# Patient Record
Sex: Female | Born: 1980 | Race: Asian | Hispanic: No | Marital: Married | Smoking: Never smoker
Health system: Southern US, Community
[De-identification: ages and names within clinical notes are randomized; demographics above are authoritative.]

## PROBLEM LIST (undated history)

## (undated) ENCOUNTER — Inpatient Hospital Stay (HOSPITAL_COMMUNITY): Payer: Self-pay

## (undated) HISTORY — PX: CHOLECYSTECTOMY: SHX55

---

## 2006-03-04 ENCOUNTER — Ambulatory Visit: Payer: Self-pay | Admitting: Internal Medicine

## 2006-03-04 DIAGNOSIS — R1013 Epigastric pain: Secondary | ICD-10-CM | POA: Insufficient documentation

## 2006-03-25 ENCOUNTER — Ambulatory Visit: Payer: Self-pay | Admitting: Family Medicine

## 2006-03-25 DIAGNOSIS — K219 Gastro-esophageal reflux disease without esophagitis: Secondary | ICD-10-CM | POA: Insufficient documentation

## 2006-04-04 ENCOUNTER — Ambulatory Visit: Payer: Self-pay | Admitting: Internal Medicine

## 2006-06-16 ENCOUNTER — Ambulatory Visit: Payer: Self-pay | Admitting: Family Medicine

## 2006-07-14 ENCOUNTER — Ambulatory Visit: Payer: Self-pay | Admitting: Family Medicine

## 2006-07-15 ENCOUNTER — Ambulatory Visit: Payer: Self-pay | Admitting: Family Medicine

## 2006-09-22 ENCOUNTER — Ambulatory Visit: Payer: Self-pay | Admitting: Family Medicine

## 2006-10-07 ENCOUNTER — Ambulatory Visit: Payer: Self-pay | Admitting: Internal Medicine

## 2006-12-29 ENCOUNTER — Ambulatory Visit: Payer: Self-pay | Admitting: Family Medicine

## 2006-12-29 LAB — CONVERTED CEMR LAB: Beta hcg, urine, semiquantitative: NEGATIVE

## 2007-02-14 ENCOUNTER — Emergency Department (HOSPITAL_COMMUNITY): Admission: EM | Admit: 2007-02-14 | Discharge: 2007-02-14 | Payer: Self-pay | Admitting: Family Medicine

## 2007-03-21 ENCOUNTER — Emergency Department (HOSPITAL_COMMUNITY): Admission: EM | Admit: 2007-03-21 | Discharge: 2007-03-21 | Payer: Self-pay | Admitting: Family Medicine

## 2007-03-21 ENCOUNTER — Encounter (INDEPENDENT_AMBULATORY_CARE_PROVIDER_SITE_OTHER): Payer: Self-pay | Admitting: Family Medicine

## 2007-03-23 ENCOUNTER — Telehealth (INDEPENDENT_AMBULATORY_CARE_PROVIDER_SITE_OTHER): Payer: Self-pay | Admitting: *Deleted

## 2007-03-23 ENCOUNTER — Ambulatory Visit: Payer: Self-pay | Admitting: Family Medicine

## 2007-03-25 ENCOUNTER — Ambulatory Visit: Payer: Self-pay | Admitting: Family Medicine

## 2007-04-18 ENCOUNTER — Emergency Department (HOSPITAL_COMMUNITY): Admission: EM | Admit: 2007-04-18 | Discharge: 2007-04-18 | Payer: Self-pay | Admitting: Family Medicine

## 2007-06-15 ENCOUNTER — Ambulatory Visit: Payer: Self-pay | Admitting: Family Medicine

## 2007-07-17 ENCOUNTER — Ambulatory Visit: Payer: Self-pay | Admitting: Internal Medicine

## 2007-07-21 ENCOUNTER — Ambulatory Visit (HOSPITAL_COMMUNITY): Admission: RE | Admit: 2007-07-21 | Discharge: 2007-07-21 | Payer: Self-pay | Admitting: Internal Medicine

## 2007-07-23 LAB — CONVERTED CEMR LAB
Alkaline Phosphatase: 58 units/L (ref 39–117)
Amylase: 37 units/L (ref 0–105)
BUN: 9 mg/dL (ref 6–23)
Basophils Absolute: 0 10*3/uL (ref 0.0–0.1)
Calcium: 9.3 mg/dL (ref 8.4–10.5)
Eosinophils Relative: 1 % (ref 0–5)
Glucose, Bld: 97 mg/dL (ref 70–99)
Lipase: 16 units/L (ref 0–75)
Lymphs Abs: 2.6 10*3/uL (ref 0.7–4.0)
Monocytes Absolute: 0.6 10*3/uL (ref 0.1–1.0)
Neutro Abs: 4.9 10*3/uL (ref 1.7–7.7)
Neutrophils Relative %: 59 % (ref 43–77)
Potassium: 3.9 meq/L (ref 3.5–5.3)
RBC: 5.41 M/uL — ABNORMAL HIGH (ref 3.87–5.11)
Total Bilirubin: 0.5 mg/dL (ref 0.3–1.2)
Total Protein: 7.8 g/dL (ref 6.0–8.3)

## 2007-07-24 ENCOUNTER — Encounter (INDEPENDENT_AMBULATORY_CARE_PROVIDER_SITE_OTHER): Payer: Self-pay | Admitting: *Deleted

## 2007-08-20 ENCOUNTER — Ambulatory Visit: Payer: Self-pay | Admitting: Internal Medicine

## 2007-08-20 DIAGNOSIS — G4700 Insomnia, unspecified: Secondary | ICD-10-CM | POA: Insufficient documentation

## 2007-09-07 ENCOUNTER — Ambulatory Visit: Payer: Self-pay | Admitting: Family Medicine

## 2007-09-18 ENCOUNTER — Ambulatory Visit: Payer: Self-pay | Admitting: Internal Medicine

## 2007-10-29 ENCOUNTER — Ambulatory Visit: Payer: Self-pay | Admitting: Internal Medicine

## 2007-10-29 DIAGNOSIS — F329 Major depressive disorder, single episode, unspecified: Secondary | ICD-10-CM | POA: Insufficient documentation

## 2007-10-29 DIAGNOSIS — R519 Headache, unspecified: Secondary | ICD-10-CM | POA: Insufficient documentation

## 2007-10-29 DIAGNOSIS — R51 Headache: Secondary | ICD-10-CM

## 2007-11-04 ENCOUNTER — Encounter (INDEPENDENT_AMBULATORY_CARE_PROVIDER_SITE_OTHER): Payer: Self-pay | Admitting: Internal Medicine

## 2007-11-04 ENCOUNTER — Encounter: Admission: RE | Admit: 2007-11-04 | Discharge: 2007-12-08 | Payer: Self-pay | Admitting: Internal Medicine

## 2007-11-20 ENCOUNTER — Ambulatory Visit: Payer: Self-pay | Admitting: Internal Medicine

## 2008-04-03 ENCOUNTER — Emergency Department (HOSPITAL_COMMUNITY): Admission: EM | Admit: 2008-04-03 | Discharge: 2008-04-03 | Payer: Self-pay | Admitting: Family Medicine

## 2009-08-14 ENCOUNTER — Ambulatory Visit: Payer: Self-pay | Admitting: Internal Medicine

## 2009-08-17 ENCOUNTER — Ambulatory Visit (HOSPITAL_COMMUNITY): Admission: RE | Admit: 2009-08-17 | Discharge: 2009-08-17 | Payer: Self-pay | Admitting: Internal Medicine

## 2009-08-17 ENCOUNTER — Encounter (INDEPENDENT_AMBULATORY_CARE_PROVIDER_SITE_OTHER): Payer: Self-pay | Admitting: Internal Medicine

## 2009-08-17 LAB — CONVERTED CEMR LAB
Albumin: 4.5 g/dL (ref 3.5–5.2)
Basophils Relative: 0 % (ref 0–1)
Eosinophils Absolute: 0.1 10*3/uL (ref 0.0–0.7)
Eosinophils Relative: 1 % (ref 0–5)
Glucose, Bld: 100 mg/dL — ABNORMAL HIGH (ref 70–99)
Hemoglobin: 10 g/dL — ABNORMAL LOW (ref 12.0–15.0)
Lymphs Abs: 2 10*3/uL (ref 0.7–4.0)
Monocytes Absolute: 0.5 10*3/uL (ref 0.1–1.0)
Monocytes Relative: 8 % (ref 3–12)
Neutrophils Relative %: 55 % (ref 43–77)
RBC: 4.59 M/uL (ref 3.87–5.11)
RDW: 14.7 % (ref 11.5–15.5)
WBC: 5.5 10*3/uL (ref 4.0–10.5)

## 2009-08-18 ENCOUNTER — Encounter (INDEPENDENT_AMBULATORY_CARE_PROVIDER_SITE_OTHER): Payer: Self-pay | Admitting: Internal Medicine

## 2009-08-18 DIAGNOSIS — K8020 Calculus of gallbladder without cholecystitis without obstruction: Secondary | ICD-10-CM | POA: Insufficient documentation

## 2009-08-24 DIAGNOSIS — D509 Iron deficiency anemia, unspecified: Secondary | ICD-10-CM

## 2009-08-24 LAB — CONVERTED CEMR LAB
Iron: 23 ug/dL — ABNORMAL LOW (ref 42–145)
TIBC: 393 ug/dL (ref 250–470)
Vitamin B-12: 298 pg/mL (ref 211–911)

## 2009-09-18 ENCOUNTER — Ambulatory Visit (HOSPITAL_COMMUNITY)
Admission: RE | Admit: 2009-09-18 | Discharge: 2009-09-18 | Payer: Self-pay | Source: Home / Self Care | Admitting: Surgery

## 2009-10-31 ENCOUNTER — Ambulatory Visit: Payer: Self-pay | Admitting: Internal Medicine

## 2009-10-31 DIAGNOSIS — R059 Cough, unspecified: Secondary | ICD-10-CM | POA: Insufficient documentation

## 2009-10-31 DIAGNOSIS — R05 Cough: Secondary | ICD-10-CM

## 2010-01-26 ENCOUNTER — Ambulatory Visit: Payer: Self-pay | Admitting: Internal Medicine

## 2010-01-26 LAB — CONVERTED CEMR LAB
Albumin: 4.8 g/dL (ref 3.5–5.2)
Amylase: 50 units/L (ref 0–105)
BUN: 7 mg/dL (ref 6–23)
Basophils Relative: 0 % (ref 0–1)
Calcium: 8.7 mg/dL (ref 8.4–10.5)
Creatinine, Ser: 0.63 mg/dL (ref 0.40–1.20)
Eosinophils Relative: 1 % (ref 0–5)
Lipase: 19 units/L (ref 0–75)
Lymphocytes Relative: 24 % (ref 12–46)
MCHC: 33 g/dL (ref 30.0–36.0)
MCV: 73.5 fL — ABNORMAL LOW (ref 78.0–100.0)
Neutro Abs: 5 10*3/uL (ref 1.7–7.7)
Neutrophils Relative %: 66 % (ref 43–77)
Platelets: 217 10*3/uL (ref 150–400)
Potassium: 3.6 meq/L (ref 3.5–5.3)
Sodium: 139 meq/L (ref 135–145)
Total Protein: 7.6 g/dL (ref 6.0–8.3)
WBC: 7.6 10*3/uL (ref 4.0–10.5)

## 2010-02-14 ENCOUNTER — Encounter (INDEPENDENT_AMBULATORY_CARE_PROVIDER_SITE_OTHER): Payer: Self-pay | Admitting: Internal Medicine

## 2010-03-27 NOTE — Letter (Signed)
Summary: TEST ORDER FORM//ULTRASOUND//APPT DATE & TIME  TEST ORDER FORM//ULTRASOUND//APPT DATE & TIME   Imported By: Arta Bruce 08/15/2009 15:49:48  _____________________________________________________________________  External Attachment:    Type:   Image     Comment:   External Document

## 2010-03-27 NOTE — Assessment & Plan Note (Signed)
Summary: STOMACH PAIN//GK   Vital Signs:  Patient profile:   30 year old female Weight:      126 pounds BMI:     23.13 Temp:     97.8 degrees F Pulse rate:   54 / minute Pulse rhythm:   regular Resp:     18 per minute BP sitting:   92 / 57  (right arm) Cuff size:   regular  Vitals Entered By: Vesta Mixer CMA (August 14, 2009 4:03 PM) CC: HA and stomach ache since yesterday.  No nausea or vomitting.  She thinks she had a fever.  Is Patient Diabetic? No Pain Assessment Patient in pain? yes     Location: stomach Intensity: 5  Does patient need assistance? Ambulation Normal   CC:  HA and stomach ache since yesterday.  No nausea or vomitting.  She thinks she had a fever. Marland Kitchen  History of Present Illness: 1.  Right upper quadrant pain:  started yesterday afternoon about 1 p.m.  Radiates to back/right shoulder blade.  Ate squash and rice for lunch right before the pain started.  Pain is constant.  Hurts more at times, but not affected by eating, bowel movement.  No definite diarrhea or constipation.  No nausea or vomiting.  Has also felt like she has had a headache and fever.  Has taken Tylenol.  Last dose was last evening.  Pt. with simlar complaints in 06/2007.  Ultrasound and ultimately CT of abdomen were both normal.  Allergies (verified): No Known Drug Allergies  Physical Exam  General:  NAD Lungs:  Normal respiratory effort, chest expands symmetrically. Lungs are clear to auscultation, no crackles or wheezes. Heart:  Normal rate and regular rhythm. S1 and S2 normal without gallop, murmur, click, rub or other extra sounds. Abdomen:  soft, normal bowel sounds, no distention, no masses, no rebound tenderness, no hepatomegaly, and no splenomegaly.  Possible mild Murph's sign   Impression & Recommendations:  Problem # 1:  EPIGASTRIC PAIN (ICD-789.06) With history of possible fever, will reevaluate, but similar work up 2 years ago negative Orders: T-Comprehensive Metabolic  Panel ([id number]) T-CBC w/Diff ([id number]) Ultrasound (Ultrasound) T-Amylase ([id number]) T-Lipase ([id number])  Complete Medication List: 1)  Depo-provera 150 Mg/ml Im Susp (Medroxyprogesterone acetate) 2)  Zoloft 50 Mg Tabs (Sertraline hcl) .... 1/2 tab by mouth for 7 days, then 1 tab daily thereafter. 3)  Pepcid 40 Mg Tabs (Famotidine) .... Take 1 tablet by mouth daily  Patient Instructions: 1)  Follow up with Dr. Delrae Alfred in 2 months--epigastric pain Prescriptions: PEPCID 40 MG TABS (FAMOTIDINE) Take 1 tablet by mouth daily  #30 x 3   Entered and Authorized by:   Julieanne Manson MD   Signed by:   Julieanne Manson MD on 08/14/2009   Method used:   Electronically to        [facility] [phone]* (retail)       [address]       [place], [place]  [address]       Ph: [phone]       Fax: [phone]   RxID:   [id number]

## 2010-03-27 NOTE — Assessment & Plan Note (Signed)
Summary: STOMACH PAIN//MC   Vital Signs:  Patient profile:   30 year old female Menstrual status:  regular LMP:     01/02/2010 Weight:      119.44 pounds Temp:     97.8 degrees F oral Pulse rate:   80 / minute Pulse rhythm:   regular Resp:     22 per minute BP sitting:   102 / 64  (left arm) Cuff size:   regular  Vitals Entered By: Hale Drone CMA (January 26, 2010 12:27 PM) CC: Here for stomach pains. Having HA's and feeling nasueas since last week. After eating chicken, she starts to itch in her abd. area for about 20 minutes.  Is Patient Diabetic? No Pain Assessment Patient in pain? yes     Location: stomach  Intensity: 1 Type: sharp Onset of pain  Eating certain food  Does patient need assistance? Functional Status Self care Ambulation Normal Comments ** Please note pt. speaks [place]. Had a hard time getting through her. Seems she can speak a little but hard for her to understand some medical terminology. Called the languae line and they do not have [place] or [place] which she speaks. *** LMP (date): 01/02/2010     Menstrual Status regular Enter LMP: 01/02/2010   CC:  Here for stomach pains. Having HA's and feeling nasueas since last week. After eating chicken and she starts to itch in her abd. area for about 20 minutes. .  History of Present Illness:  No interpreter available for pt's [place] dialect.  Right upper quadrant pain that radiates around to same level at back.  Not clear if this is similar to what she was experiencing before having gallbladder removed in July of this year.  Not clear if has associated nausea.  No vomiting.  No diarrhea or constipation.  Last time had pain was last month--had 2 episodes lasting maybe 1 1/2 days.  Does recall that ate chicken and hot pepper and got the pain.  Does have a headache with the pain.  Current Medications (verified): 1)  Depo-Provera 150 Mg/ml Im Susp (Medroxyprogesterone Acetate) 2)  Zoloft 50 Mg  Tabs  (Sertraline Hcl) .... 1/2 Tab By Mouth For 7 Days, Then 1 Tab Daily Thereafter. 3)  Pepcid 40 Mg Tabs (Famotidine) .... Take 1 Tablet By Mouth Daily 4)  Ferrous Sulfate 325 (65 Fe) Mg Tabs (Ferrous Sulfate) .Marland Kitchen.. 1 Tab By Mouth Daily 5)  Loratadine 10 Mg Tabs (Loratadine) .Marland Kitchen.. 1 Tab By Mouth Daily  Allergies (verified): No Known Drug Allergies  Physical Exam  General:  NAD Lungs:  Normal respiratory effort, chest expands symmetrically. Lungs are clear to auscultation, no crackles or wheezes. Heart:  Normal rate and regular rhythm. S1 and S2 normal without gallop, murmur, click, rub or other extra sounds. Abdomen:  Mild midepigastric tenderness and scattered areas of tenderness of general abdomen.  Soft, No HSM or mass appreciated.  No rebound or peritoneal signs   Impression & Recommendations:  Problem # 1:  EPIGASTRIC PAIN (ICD-789.06) Limited by language barrier. Unable to use Language line as they do not have someone available for this [place] dialect. Sounds like pt. had some GI disturbance that has since passed, but will check labs secondary to minimal tenderness today. Orders: T-Comprehensive Metabolic Panel [id number]) T-CBC w/Diff [id number]) T-Amylase [id number]) T-Lipase [id number])  Complete Medication List: 1)  Depo-provera 150 Mg/ml Im Susp (Medroxyprogesterone acetate) 2)  Zoloft 50 Mg Tabs (Sertraline hcl) .... 1/2 tab by mouth for 7 days, then  1 tab daily thereafter. 3)  Pepcid 40 Mg Tabs (Famotidine) .... Take 1 tablet by mouth daily 4)  Ferrous Sulfate 325 (65 Fe) Mg Tabs (Ferrous sulfate) .Marland Kitchen.. 1 tab by mouth daily 5)  Loratadine 10 Mg Tabs (Loratadine) .Marland Kitchen.. 1 tab by mouth daily  Patient Instructions: 1)  Call if your pain comes back--make sure you bring in someone who can intepret you language to English when you come back   Orders Added: 1)  Est. Patient Level III [[address]] 2)  T-Comprehensive Metabolic Panel [[id number]] 3)  T-CBC w/Diff  [[id number]] 4)  T-Amylase [[id number]] 5)  T-Lipase [[id number]]

## 2010-03-27 NOTE — Assessment & Plan Note (Signed)
Summary: two month f/u////cns   Vital Signs:  Patient profile:   30 year old female Height:      62 inches Weight:      119.5 pounds BMI:     21.94 Temp:     97.8 degrees F oral Pulse rate:   64 / minute Pulse rhythm:   regular Resp:     16 per minute BP sitting:   92 / 64  (right arm) Cuff size:   regular  Vitals Entered By: Michelle Nasuti (October 31, 2009 11:43 AM) CC: c/o cough causing pains in ribs/stomach. sore throat an f/u after surgery? Pain Assessment Patient in pain? yes      Intensity: 1  Does patient need assistance? Functional Status Self care Ambulation Normal   CC:  c/o cough causing pains in ribs/stomach. sore throat an f/u after surgery?.  History of Present Illness: 1.  Cholelithiasis:  had lap cholecystectomy July 25.  Did well.  Dr. Corliss Skains  2.  Cough:  Started one week after surgery above.  Nonproductive.  No fever.  Has had a headache since last night as well.  Feels cough is coming from epigastric, low sternal area--sounds like because when she coughs, it hurts there.  .  No dyspnea.  Has had a sore throat if coughing a lot. No ear pain.  Sometimes has sneezing.  No runny nose. Has tried Dayquil and Tylenol.  Has helped with the cough.  Allergies (verified): No Known Drug Allergies  Past History:  Past Surgical History: 1.  s/p wisdom teeth extraction. 2.  09/18/09:  Laparoscopic Cholecystectomy--Dr. Corliss Skains  Physical Exam  General:  NAD Eyes:  No conjunctival injection Ears:  External ear exam shows no significant lesions or deformities.  Otoscopic examination reveals clear canals, tympanic membranes are intact bilaterally without bulging, retraction, inflammation or discharge. Hearing is grossly normal bilaterally. Nose:  mucosa with mild swelling, clear discharge. Mouth:  Mild cobbling of posterior pharnynx Neck:  No deformities, masses, or tenderness noted. Lungs:  Normal respiratory effort, chest expands symmetrically. Lungs are clear to  auscultation, no crackles or wheezes. Heart:  Normal rate and regular rhythm. S1 and S2 normal without gallop, murmur, click, rub or other extra sounds. Abdomen:  Mild epigastic tenderness--seems more like of the musculaturesoft, normal bowel sounds, no hepatomegaly, and no splenomegaly.  Surgical wounds healing well.   Impression & Recommendations:  Problem # 1:  COUGH (ICD-786.2) Probable allergies Start Loratadine.  Complete Medication List: 1)  Depo-provera 150 Mg/ml Im Susp (Medroxyprogesterone acetate) 2)  Zoloft 50 Mg Tabs (Sertraline hcl) .... 1/2 tab by mouth for 7 days, then 1 tab daily thereafter. 3)  Pepcid 40 Mg Tabs (Famotidine) .... Take 1 tablet by mouth daily 4)  Ferrous Sulfate 325 (65 Fe) Mg Tabs (Ferrous sulfate) .Marland Kitchen.. 1 tab by mouth daily 5)  Loratadine 10 Mg Tabs (Loratadine) .Marland Kitchen.. 1 tab by mouth daily  Patient Instructions: 1)  Pick up your allergy medicine at [facility] Prescriptions: LORATADINE 10 MG TABS (LORATADINE) 1 tab by mouth daily  #30 x 4   Entered and Authorized by:   Julieanne Manson MD   Signed by:   Julieanne Manson MD on 10/31/2009   Method used:   Faxed to ...       [facility] (retail)       [address] [place], [place]  [address]       Ph: [phone]  Fax: [phone]   RxID:   [id number]

## 2010-03-29 NOTE — Letter (Signed)
Summary: *HSN Results Follow up  [facility]  [address] [place], [place] [address]   Phone: [phone]  Fax: [phone]      02/14/2010   Mikela Sakata [address] [place], [place]  [address]   Dear  Ms. Conita Schepp,                            ____S.Drinkard,FNP   ____D. Gore,FNP       ____B. McPherson,MD   ____V. Rankins,MD    __X__E. Nobuko Gsell,MD    ____N. Daphine Deutscher, FNP  ____D. Reche Dixon, MD    ____K. Philipp Deputy, MD    ____Other     This letter is to inform you that your recent test(s):  _______Pap Smear    ___X____Lab Test     _______X-ray    ___X____ is within acceptable limits  _______ requires a medication change  _______ requires a follow-up lab visit  _______ requires a follow-up visit with your provider   Comments:  Your labs were okay except you still have a little low iron.  Would like you to take iron--have called into [facility] pharmacy.  Take 1 tab daily.         _________________________________________________________ If you have any questions, please contact our office                     Sincerely,  Julieanne Manson MD [facility]

## 2010-03-29 NOTE — Miscellaneous (Signed)
  Clinical Lists Changes  Medications: Rx of FERROUS SULFATE 325 (65 FE) MG TABS (FERROUS SULFATE) 1 tab by mouth daily;  #30 x 4;  Signed;  Entered by: Julieanne Manson MD;  Authorized by: Julieanne Manson MD;  Method used: Faxed to [facility], [address], [place], [place]  [address], Ph: [phone], Fax: [phone]    Prescriptions: FERROUS SULFATE 325 (65 FE) MG TABS (FERROUS SULFATE) 1 tab by mouth daily  #30 x 4   Entered and Authorized by:   Julieanne Manson MD   Signed by:   Julieanne Manson MD on 02/14/2010   Method used:   Faxed to ...       [facility] (retail)       [address] [place], [place]  [address]       Ph: [phone]       Fax: [phone]   RxID:   [id number]

## 2010-05-12 LAB — CBC
HCT: 32.4 % — ABNORMAL LOW (ref 36.0–46.0)
MCH: 22.6 pg — ABNORMAL LOW (ref 26.0–34.0)
Platelets: 166 10*3/uL (ref 150–400)
RBC: 4.71 MIL/uL (ref 3.87–5.11)
WBC: 5.6 10*3/uL (ref 4.0–10.5)

## 2010-05-12 LAB — COMPREHENSIVE METABOLIC PANEL
ALT: 10 U/L (ref 0–35)
Albumin: 3.8 g/dL (ref 3.5–5.2)
Alkaline Phosphatase: 37 U/L — ABNORMAL LOW (ref 39–117)
BUN: 6 mg/dL (ref 6–23)
CO2: 25 mEq/L (ref 19–32)
Glucose, Bld: 97 mg/dL (ref 70–99)
Potassium: 3.1 mEq/L — ABNORMAL LOW (ref 3.5–5.1)
Sodium: 139 mEq/L (ref 135–145)
Total Protein: 6.9 g/dL (ref 6.0–8.3)

## 2010-05-12 LAB — DIFFERENTIAL
Basophils Absolute: 0 10*3/uL (ref 0.0–0.1)
Lymphocytes Relative: 35 % (ref 12–46)
Monocytes Relative: 12 % (ref 3–12)

## 2010-11-04 ENCOUNTER — Inpatient Hospital Stay (INDEPENDENT_AMBULATORY_CARE_PROVIDER_SITE_OTHER)
Admission: RE | Admit: 2010-11-04 | Discharge: 2010-11-04 | Disposition: A | Discharge status: Home / Self Care | Payer: Self-pay | Source: Ambulatory Visit | Attending: Family Medicine | Admitting: Family Medicine

## 2010-11-04 ENCOUNTER — Emergency Department (HOSPITAL_COMMUNITY): Payer: Self-pay

## 2010-11-04 ENCOUNTER — Emergency Department (HOSPITAL_COMMUNITY)
Admission: EM | Admit: 2010-11-04 | Discharge: 2010-11-04 | Disposition: A | Discharge status: Home / Self Care | Payer: Self-pay | Attending: Emergency Medicine | Admitting: Emergency Medicine

## 2010-11-04 DIAGNOSIS — R1011 Right upper quadrant pain: Secondary | ICD-10-CM | POA: Insufficient documentation

## 2010-11-04 DIAGNOSIS — K8020 Calculus of gallbladder without cholecystitis without obstruction: Secondary | ICD-10-CM | POA: Insufficient documentation

## 2010-11-04 DIAGNOSIS — R109 Unspecified abdominal pain: Secondary | ICD-10-CM

## 2010-11-04 DIAGNOSIS — R110 Nausea: Secondary | ICD-10-CM | POA: Insufficient documentation

## 2010-11-04 LAB — DIFFERENTIAL
Basophils Absolute: 0 10*3/uL (ref 0.0–0.1)
Basophils Relative: 0 % (ref 0–1)
Eosinophils Absolute: 0.1 10*3/uL (ref 0.0–0.7)
Eosinophils Relative: 1 % (ref 0–5)
Lymphocytes Relative: 34 % (ref 12–46)
Lymphs Abs: 2 10*3/uL (ref 0.7–4.0)
Monocytes Relative: 8 % (ref 3–12)
Neutrophils Relative %: 57 % (ref 43–77)

## 2010-11-04 LAB — HEPATIC FUNCTION PANEL
AST: 18 U/L (ref 0–37)
Albumin: 4.1 g/dL (ref 3.5–5.2)
Alkaline Phosphatase: 38 U/L — ABNORMAL LOW (ref 39–117)
Total Bilirubin: 0.2 mg/dL — ABNORMAL LOW (ref 0.3–1.2)
Total Protein: 7.4 g/dL (ref 6.0–8.3)

## 2010-11-04 LAB — BASIC METABOLIC PANEL
BUN: 8 mg/dL (ref 6–23)
Calcium: 9.6 mg/dL (ref 8.4–10.5)
GFR calc Af Amer: >60 mL/min (ref >60)
GFR calc non Af Amer: >60 mL/min (ref >60)

## 2010-11-04 LAB — CBC
HCT: 35.7 % — ABNORMAL LOW (ref 36.0–46.0)
MCH: 25.9 pg — ABNORMAL LOW (ref 26.0–34.0)
RBC: 4.78 MIL/uL (ref 3.87–5.11)
RDW: 12.4 % (ref 11.5–15.5)
WBC: 5.8 10*3/uL (ref 4.0–10.5)

## 2010-11-04 LAB — URINALYSIS, ROUTINE W REFLEX MICROSCOPIC
Glucose, UA: NEGATIVE mg/dL
Protein, ur: NEGATIVE mg/dL
Specific Gravity, Urine: 1.016 (ref 1.005–1.030)
Urobilinogen, UA: 1 mg/dL (ref 0.0–1.0)
pH: 7 (ref 5.0–8.0)

## 2010-11-04 LAB — POCT URINALYSIS DIP (DEVICE)
Leukocytes, UA: NEGATIVE
Protein, ur: NEGATIVE mg/dL
Urobilinogen, UA: 0.2 mg/dL (ref 0.0–1.0)
pH: 6 (ref 5.0–8.0)

## 2010-11-04 LAB — LIPASE, BLOOD: Lipase: 26 U/L (ref 11–59)

## 2010-11-04 LAB — URINE MICROSCOPIC-ADD ON

## 2010-11-15 LAB — CBC
MCHC: 33
MCV: 75.5 — ABNORMAL LOW
Platelets: 237
WBC: 11.6 — ABNORMAL HIGH

## 2010-11-15 LAB — POCT URINALYSIS DIP (DEVICE)
Bilirubin Urine: NEGATIVE
Nitrite: NEGATIVE
Operator id: 270961
pH: 7

## 2010-11-15 LAB — DIFFERENTIAL
Eosinophils Relative: 1
Lymphocytes Relative: 28
Lymphs Abs: 3.3
Monocytes Absolute: 0.9

## 2010-11-15 LAB — COMPREHENSIVE METABOLIC PANEL
AST: 21
Albumin: 4.5
Calcium: 9.5
Chloride: 102
Creatinine, Ser: 0.7
GFR calc Af Amer: >60

## 2010-11-15 LAB — POCT PREGNANCY, URINE: Preg Test, Ur: NEGATIVE

## 2010-11-16 LAB — POCT URINALYSIS DIP (DEVICE)
Glucose, UA: NEGATIVE
Ketones, ur: NEGATIVE
Operator id: 247071
Specific Gravity, Urine: 1.02

## 2010-11-30 LAB — POCT RAPID STREP A: Streptococcus, Group A Screen (Direct): POSITIVE — AB

## 2011-10-06 ENCOUNTER — Encounter (HOSPITAL_COMMUNITY): Payer: Self-pay

## 2011-10-06 ENCOUNTER — Emergency Department (HOSPITAL_COMMUNITY)
Admission: EM | Admit: 2011-10-06 | Discharge: 2011-10-06 | Disposition: A | Discharge status: Home / Self Care | Payer: Self-pay | Source: Home / Self Care | Attending: Emergency Medicine | Admitting: Emergency Medicine

## 2011-10-06 DIAGNOSIS — N946 Dysmenorrhea, unspecified: Secondary | ICD-10-CM

## 2011-10-06 DIAGNOSIS — L259 Unspecified contact dermatitis, unspecified cause: Secondary | ICD-10-CM

## 2011-10-06 LAB — POCT URINALYSIS DIP (DEVICE)
Glucose, UA: NEGATIVE mg/dL
Ketones, ur: NEGATIVE mg/dL
Specific Gravity, Urine: <=1.005 (ref 1.005–1.030)
Urobilinogen, UA: 0.2 mg/dL (ref 0.0–1.0)

## 2011-10-06 MED ORDER — LORATADINE 10 MG PO TABS: 10.0000 mg | ORAL_TABLET | Freq: Every day | ORAL | Status: DC

## 2011-10-06 MED ORDER — NAPROXEN 375 MG PO TABS: 375.0000 mg | ORAL_TABLET | Freq: Two times a day (BID) | ORAL | Status: AC | PRN

## 2011-10-06 NOTE — ED Provider Notes (Signed)
History     CSN: [id number]  Arrival date & time 10/06/11  1348   None     Chief Complaint  Patient presents with  . Sore Throat    (Consider location/radiation/quality/duration/timing/severity/associated sxs/prior treatment) HPI Comments: Pt reports that after her period started on 7/28, she developed mild sore throat, rash on cheeks, and itchy lips. Has a job sewing.  Also c/o abd pain.  Denies new contacts/lotions/soap/meds/detergents.  Isn't sure what she might have been exposed to at work sewing.   Patient is a 31 y.o. female presenting with pharyngitis, abdominal pain, and rash. The history is provided by the patient and a friend. A language interpreter was used (pt accompanied by friend who speaks english).  Sore Throat This is a new problem. Episode onset: 2.5 weeks ago. The problem occurs constantly. The problem has not changed since onset.Associated symptoms include abdominal pain. Pertinent negatives include no shortness of breath. Nothing aggravates the symptoms. Nothing relieves the symptoms.  Abdominal Pain The primary symptoms of the illness include abdominal pain and dysuria. The primary symptoms of the illness do not include fever, shortness of breath, nausea, vomiting, diarrhea or vaginal discharge. Episode onset: 2.5 weeks ago. The onset of the illness was gradual. The problem has not changed since onset. The dysuria is not associated with hematuria.   The patient states that she believes she is currently not pregnant. Symptoms associated with the illness do not include chills, anorexia, hematuria or back pain.  Rash  This is a new problem. Episode onset: 2.5 weeks ago. The problem has not changed since onset.The problem is associated with an unknown factor. There has been no fever. The rash is present on the face and lips. Associated symptoms include itching. Pertinent negatives include no pain. She has tried nothing for the symptoms.    History reviewed. No pertinent  past medical history.  Past Surgical History  Procedure Date  . Cholecystectomy     History reviewed. No pertinent family history.  History  Substance Use Topics  . Smoking status: Never Smoker   . Smokeless tobacco: Not on file  . Alcohol Use: No    OB History    Grav Para Term Preterm Abortions TAB SAB Ect Mult Living                  Review of Systems  Constitutional: Negative for fever and chills.  HENT: Positive for sore throat.        Itchy lips, but they are not swollen and she doesn't feel like her mouth/throat are swollen  Respiratory: Negative for shortness of breath.   Gastrointestinal: Positive for abdominal pain. Negative for nausea, vomiting, diarrhea and anorexia.  Genitourinary: Positive for dysuria. Negative for hematuria and vaginal discharge.  Musculoskeletal: Negative for back pain.  Skin: Positive for itching and rash.    Allergies  Review of patient's allergies indicates no known allergies.  Home Medications   Current Outpatient Rx  Name Route Sig Dispense Refill  . ACETAMINOPHEN 325 MG PO TABS Oral Take 650 mg by mouth every 6 (six) hours as needed.    Marland Kitchen LORATADINE 10 MG PO TABS Oral Take 1 tablet (10 mg total) by mouth daily. 30 tablet 3  . NAPROXEN 375 MG PO TABS Oral Take 1 tablet (375 mg total) by mouth 2 (two) times daily as needed. 30 tablet 0    BP 119/58  Pulse 65  Temp 98.3 F (36.8 C) (Oral)  Resp 18  SpO2 100%  LMP 09/22/2011  Physical Exam  Constitutional: She appears well-developed and well-nourished. No distress.  HENT:  Right Ear: Tympanic membrane, external ear and ear canal normal.  Left Ear: Tympanic membrane, external ear and ear canal normal.  Mouth/Throat: Uvula is midline, oropharynx is clear and moist and mucous membranes are normal. No oral lesions. No uvula swelling.  Cardiovascular: Normal rate and regular rhythm.   Pulmonary/Chest: Effort normal and breath sounds normal.  Abdominal: Soft. Bowel sounds are  normal. She exhibits no distension. There is tenderness in the suprapubic area. There is no rigidity, no rebound, no guarding and no CVA tenderness.  Skin: Skin is warm, dry and intact. Rash noted. Rash is macular.       Brown pigmentation on B cheeks, appears similar to melasma.     ED Course  Procedures (including critical care time)   Labs Reviewed  POCT URINALYSIS DIP (DEVICE)   No results found.   1. Contact dermatitis   2. Dysmenorrhea       MDM  After more conversation about abd pain, it appears pt has abd pain with her periods, and requests medicine to use for abd pain assoc with periods.  Her throat, mouth and lips appear normal, but she does have a patchy brown rash on her cheeks.  I think her sx might be connected, and related to a contact derm/allergic reaction to something.           Cathlyn Parsons, NP 10/06/11 2157

## 2011-10-06 NOTE — ED Provider Notes (Deleted)
History     CSN: [id number]  Arrival date & time 10/06/11  1348   None     Chief Complaint  Patient presents with  . Sore Throat    (Consider location/radiation/quality/duration/timing/severity/associated sxs/prior treatment) HPI Comments: Rash, sore throat and lip burning since 7/28 after period started.  C/o abd pain with periods.   Patient is a 31 y.o. female presenting with pharyngitis.  Sore Throat    History reviewed. No pertinent past medical history.  Past Surgical History  Procedure Date  . Cholecystectomy     History reviewed. No pertinent family history.  History  Substance Use Topics  . Smoking status: Never Smoker   . Smokeless tobacco: Not on file  . Alcohol Use: No    OB History    Grav Para Term Preterm Abortions TAB SAB Ect Mult Living                  Review of Systems  Allergies  Review of patient's allergies indicates no known allergies.  Home Medications   Current Outpatient Rx  Name Route Sig Dispense Refill  . ACETAMINOPHEN 325 MG PO TABS Oral Take 650 mg by mouth every 6 (six) hours as needed.    Marland Kitchen LORATADINE 10 MG PO TABS Oral Take 1 tablet (10 mg total) by mouth daily. 30 tablet 3  . NAPROXEN 375 MG PO TABS Oral Take 1 tablet (375 mg total) by mouth 2 (two) times daily as needed. 30 tablet 0    BP 119/58  Pulse 65  Temp 98.3 F (36.8 C) (Oral)  Resp 18  SpO2 100%  LMP 09/22/2011  Physical Exam  ED Course  Procedures (including critical care time)   Labs Reviewed  POCT URINALYSIS DIP (DEVICE)   No results found.   1. Contact dermatitis       MDM          Cathlyn Parsons, NP 10/06/11 1619

## 2011-10-06 NOTE — ED Notes (Signed)
Pt has had sore throat, headache and dry itchy lips since July 25.

## 2011-10-23 NOTE — ED Provider Notes (Signed)
Medical screening examination/treatment/procedure(s) were performed by non-physician practitioner and as supervising physician I was immediately available for consultation/collaboration.  Lema Heinkel M. MD   Corryn Madewell M Falana Clagg, MD 10/23/11 2106 

## 2012-01-14 IMAGING — US US ABDOMEN COMPLETE
1 series · 13 of 25 positions shown · non-contrast
Comparison: Abdominal ultrasound 03/21/2007.  CT abdomen
07/21/2007.

CLINICAL DATA: Epigastric abdominal pain.

COMPLETE ABDOMINAL ULTRASOUND 08/17/2009:

[Series 1: us abdomen complete · 0.24mm/px · 13 of 77 slices shown]
[im 1/77]
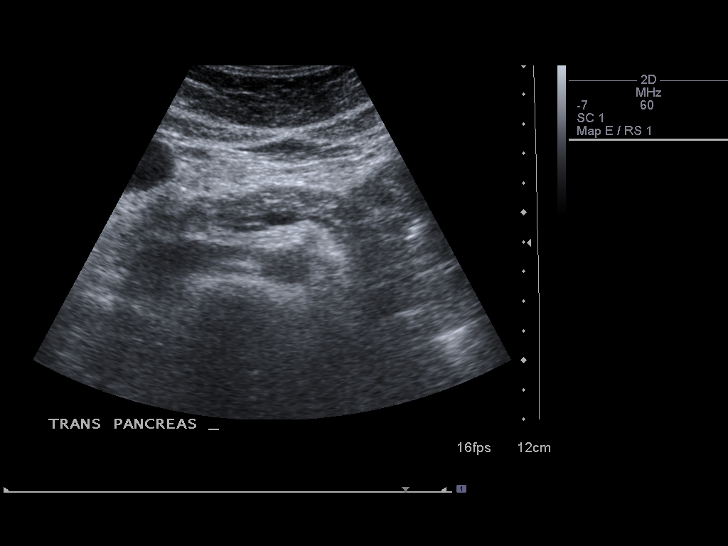
[im 7/77]
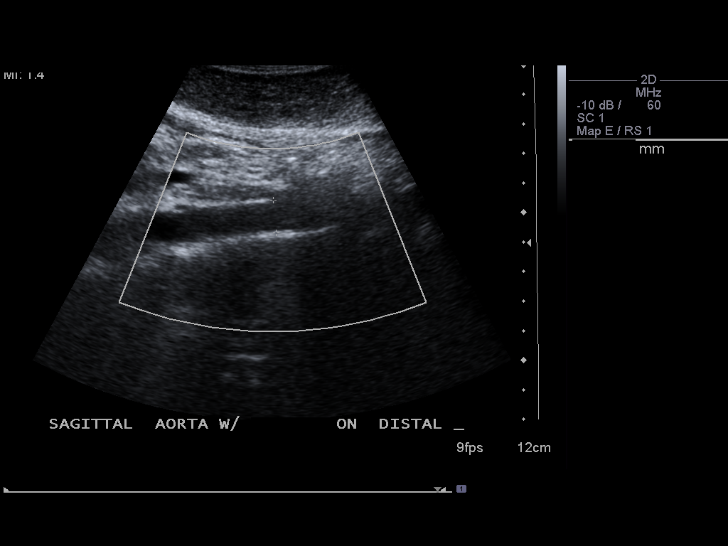
[im 13/77]
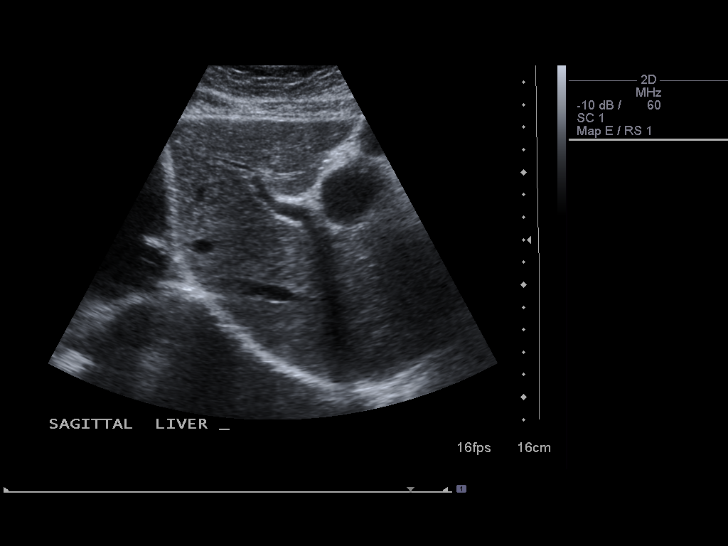
[im 20/77]
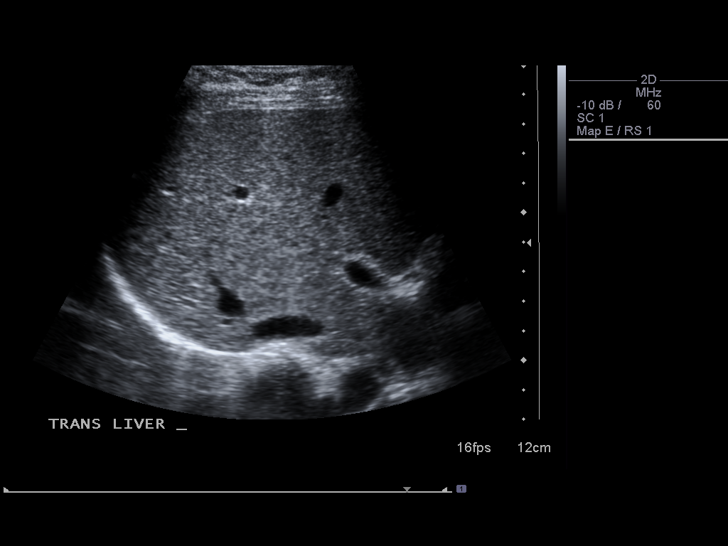
[im 26/77]
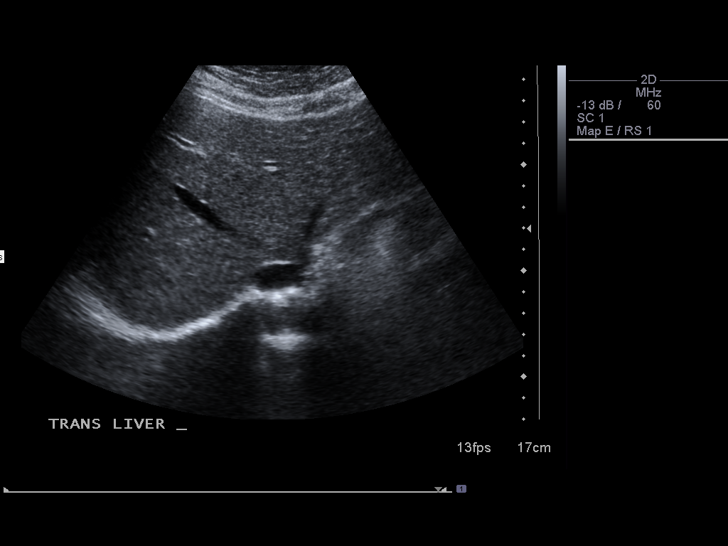
[im 32/77]
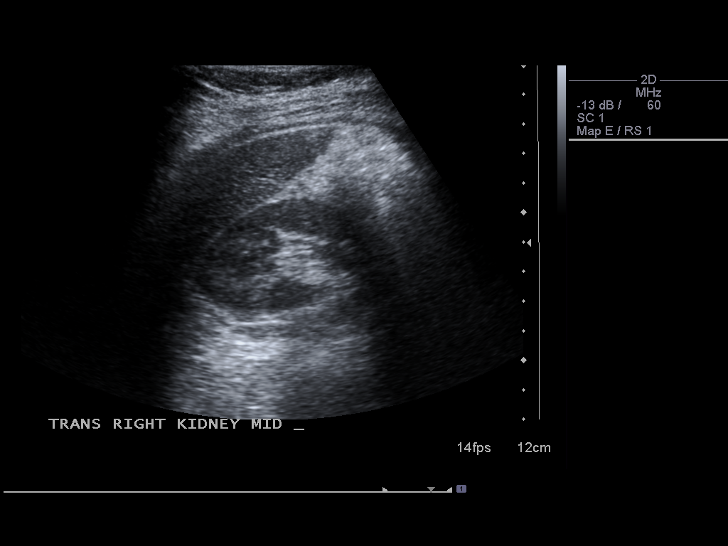
[im 39/77]
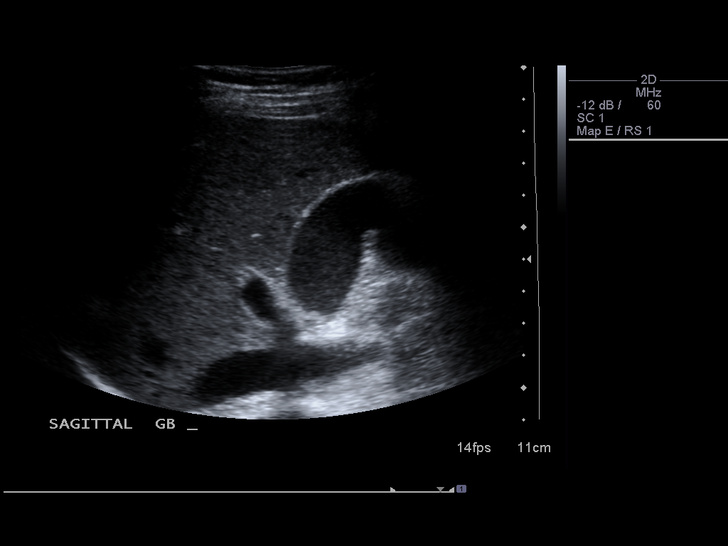
[im 45/77]
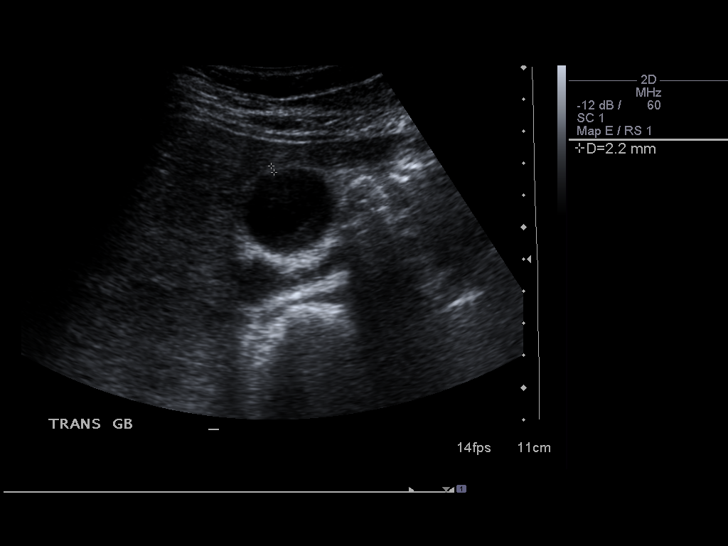
[im 51/77]
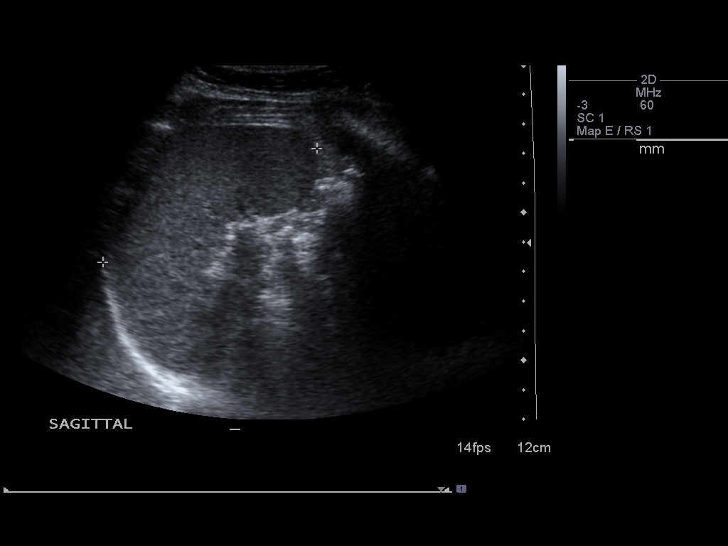
[im 58/77]
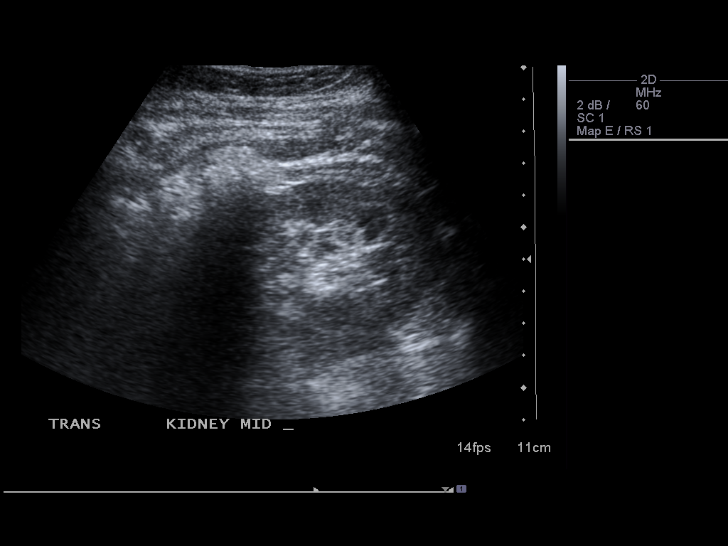
[im 64/77]
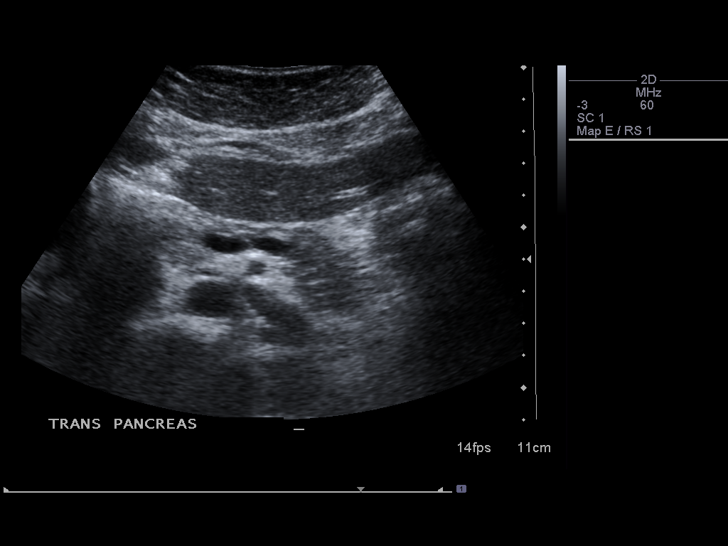
[im 70/77]
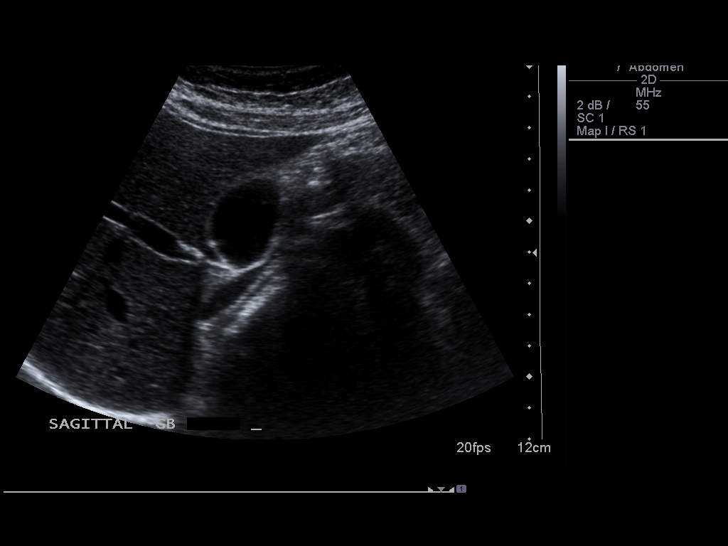
[im 77/77]
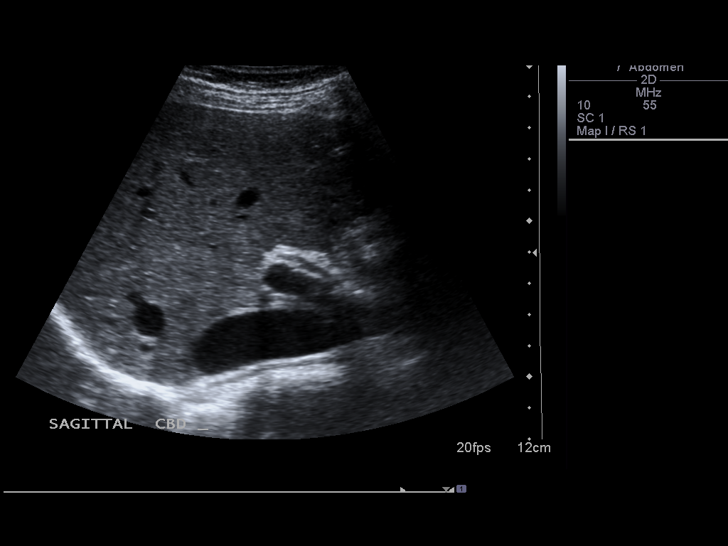

[13 of 25 positions shown; findings below may reference images not displayed]

FINDINGS: Gallbladder:  Small shadowing gallstones in the gallbladder neck,
on the order of 4 mm in size.  Echogenic sludge.  No gallbladder
wall thickening or pericholecystic fluid.  Negative sonographic
Murphy's sign according to the ultrasound technologist.

Common bile duct:  Normal in caliber with maximum diameter
approximating 3 mm.

Liver:  Normal size and echotexture without focal parenchymal
abnormality.  Patent portal vein with hepatopetal flow.

IVC:  Patent.

Pancreas:  Normal size and echotexture without focal parenchymal
abnormality .

Spleen:  Normal size and echotexture without focal parenchymal
abnormality.

Right Kidney:  No hydronephrosis.  Well-preserved cortex.  No
shadowing calculi.  Normal size and parenchymal echotexture without
focal abnormalities.

Left Kidney:  No hydronephrosis.  Well-preserved cortex.  No
shadowing calculi.  Normal size and parenchymal echotexture without
focal abnormalities.

Abdominal aorta:  Intimal aorta.
IMPRESSION: 1.  Cholelithiasis and gallbladder sludge.  No sonographic evidence
of acute cholecystitis.  No biliary ductal dilation.
2.  Otherwise normal abdominal ultrasound.

## 2012-11-07 ENCOUNTER — Inpatient Hospital Stay (HOSPITAL_COMMUNITY): Payer: Medicaid Other

## 2012-11-07 ENCOUNTER — Encounter (HOSPITAL_COMMUNITY): Payer: Self-pay | Admitting: Emergency Medicine

## 2012-11-07 ENCOUNTER — Emergency Department (INDEPENDENT_AMBULATORY_CARE_PROVIDER_SITE_OTHER)
Admission: EM | Admit: 2012-11-07 | Discharge: 2012-11-07 | Disposition: A | Discharge status: Home / Self Care | Payer: Medicaid Other | Source: Home / Self Care

## 2012-11-07 ENCOUNTER — Encounter (HOSPITAL_COMMUNITY): Payer: Self-pay | Admitting: *Deleted

## 2012-11-07 ENCOUNTER — Inpatient Hospital Stay (HOSPITAL_COMMUNITY)
Admission: AD | Admit: 2012-11-07 | Discharge: 2012-11-07 | Disposition: A | Discharge status: Home / Self Care | Payer: Medicaid Other | Source: Ambulatory Visit | Attending: Obstetrics & Gynecology | Admitting: Obstetrics & Gynecology

## 2012-11-07 DIAGNOSIS — O99891 Other specified diseases and conditions complicating pregnancy: Secondary | ICD-10-CM | POA: Insufficient documentation

## 2012-11-07 DIAGNOSIS — Z3201 Encounter for pregnancy test, result positive: Secondary | ICD-10-CM

## 2012-11-07 DIAGNOSIS — R109 Unspecified abdominal pain: Secondary | ICD-10-CM | POA: Insufficient documentation

## 2012-11-07 DIAGNOSIS — Z3490 Encounter for supervision of normal pregnancy, unspecified, unspecified trimester: Secondary | ICD-10-CM

## 2012-11-07 DIAGNOSIS — N949 Unspecified condition associated with female genital organs and menstrual cycle: Secondary | ICD-10-CM

## 2012-11-07 DIAGNOSIS — O26899 Other specified pregnancy related conditions, unspecified trimester: Secondary | ICD-10-CM

## 2012-11-07 DIAGNOSIS — N912 Amenorrhea, unspecified: Secondary | ICD-10-CM

## 2012-11-07 LAB — POCT URINALYSIS DIP (DEVICE)
Glucose, UA: 100 mg/dL — AB
Specific Gravity, Urine: 1.01 (ref 1.005–1.030)
Urobilinogen, UA: 0.2 mg/dL (ref 0.0–1.0)

## 2012-11-07 LAB — POCT PREGNANCY, URINE: Preg Test, Ur: POSITIVE — AB

## 2012-11-07 LAB — CBC
MCV: 73.1 fL — ABNORMAL LOW (ref 78.0–100.0)
Platelets: 166 10*3/uL (ref 150–400)
RBC: 4.84 MIL/uL (ref 3.87–5.11)
WBC: 6.9 10*3/uL (ref 4.0–10.5)

## 2012-11-07 LAB — WET PREP, GENITAL: Yeast Wet Prep HPF POC: NONE SEEN

## 2012-11-07 LAB — ABO/RH: ABO/RH(D): A POS

## 2012-11-07 LAB — HCG, QUANTITATIVE, PREGNANCY: hCG, Beta Chain, Quant, S: 21202 m[IU]/mL — ABNORMAL HIGH (ref <5)

## 2012-11-07 NOTE — MAU Provider Note (Signed)
History     CSN: [id number]  Arrival date and time: 11/07/12 1111   None     Chief Complaint  Patient presents with  . Pelvic Pain   HPI  Ms. Hayley Moore is a 32 y.o. female; [phone] at [redacted]w[redacted]d who presents to MAU for further evaluation for positive pregnancy test and pain. She was seen at urgent care prior to MAU and had pain on exam; they sent her here to rule out ectopic pregnancy. The patient denies bleeding and is not having pain now. She is unsure where she plans to go for prenatal care.    OB History   Grav Para Term Preterm Abortions TAB SAB Ect Mult Living   4 3 3       3       History reviewed. No pertinent past medical history.  Past Surgical History  Procedure Laterality Date  . Cholecystectomy      History reviewed. No pertinent family history.  History  Substance Use Topics  . Smoking status: Never Smoker   . Smokeless tobacco: Not on file  . Alcohol Use: No    Allergies: No Known Allergies  No prescriptions prior to admission   Results for orders placed during the hospital encounter of 11/07/12 (from the past 24 hour(s))  CBC     Status: Abnormal   Collection Time    11/07/12 11:55 AM      Result Value Range   WBC 6.9  4.0 - 10.5 K/uL   RBC 4.84  3.87 - 5.11 MIL/uL   Hemoglobin 12.2  12.0 - 15.0 g/dL   HCT 14.7 (*) 82.9 - 56.2 %   MCV 73.1 (*) 78.0 - 100.0 fL   MCH 25.2 (*) 26.0 - 34.0 pg   MCHC 34.5  30.0 - 36.0 g/dL   RDW 13.0  86.5 - 78.4 %   Platelets 166  150 - 400 K/uL  ABO/RH     Status: None   Collection Time    11/07/12 11:55 AM      Result Value Range   ABO/RH(D) A POS    HCG, QUANTITATIVE, PREGNANCY     Status: Abnormal   Collection Time    11/07/12 11:55 AM      Result Value Range   hCG, Beta Chain, Quant, S 21202 (*) <5 mIU/mL  WET PREP, GENITAL     Status: Abnormal   Collection Time    11/07/12  1:38 PM      Result Value Range   Yeast Wet Prep HPF POC NONE SEEN  NONE SEEN   Trich, Wet Prep NONE SEEN  NONE SEEN   Clue  Cells Wet Prep HPF POC NONE SEEN  NONE SEEN   WBC, Wet Prep HPF POC MANY (*) NONE SEEN   US Ob Comp Less 14 Wks  11/07/2012   *RADIOLOGY REPORT*  Clinical Data: Pregnant, 1 week pelvic pain  OBSTETRIC <14 WK ULTRASOUND  Technique:  Transabdominal ultrasound was performed for evaluation of the gestation as well as the maternal uterus and adnexal regions.  Comparison:  CT abdomen/pelvis 11/04/2010  Intrauterine gestational sac: Single intrauterine gestational sac containing a fetus. Yolk sac: Not visualized Embryo: Yes Cardiac Activity: Yes Heart Rate: 152 bpm  CRL:  78.4 mm  14 w  zero d         [place] EDC: May 08, 2013  Maternal uterus/Adnexae: No subchorionic hemorrhage identified.  The left ovary is sonographically unremarkable 3.3 x 1.4 x 8-0.2 cm.  The  right ovary is sonographically unremarkable at 2.1 x 1.1 x 1.8 cm.  No significant free fluid.  IMPRESSION:  Viable single intrauterine gestation as above with an estimated gestational age of [redacted] weeks 0 days by crown rump length.   Original Report Authenticated By: Malachy Moan, M.D.    Review of Systems  Constitutional: Negative for fever and chills.  Gastrointestinal: Positive for nausea, vomiting and abdominal pain. Negative for diarrhea and constipation.       Mild cramping in lower, mid abdomen   Genitourinary: Negative for dysuria, urgency and frequency.       No vaginal discharge. No vaginal bleeding. No dysuria.   Neurological: Positive for headaches.   Physical Exam   Blood pressure 107/56, pulse 72, temperature 98.1 F (36.7 C), temperature source Oral, resp. rate 16, last menstrual period 08/04/2012, SpO2 98.00%.  Physical Exam  Constitutional: She is oriented to person, place, and time. She appears well-developed and well-nourished. No distress.  Neck: Neck supple.  GI: Soft. There is tenderness. There is no guarding.  Minor tenderness at umbilicus   Genitourinary: Vagina normal.  Speculum exam: Vagina - Small amount of  creamy discharge, no odor Cervix - No contact bleeding Bimanual exam: Cervix closed Uterus non tender, normal size for gestational age Adnexa non tender, no masses bilaterally GC/Chlam, wet prep done Chaperone present for exam.   Neurological: She is alert and oriented to person, place, and time.  Skin: Skin is warm. She is not diaphoretic.  Psychiatric: Her behavior is normal.    MAU Course  Procedures  MDM CBC Beta Hcg ABO/RH   A positive blood type   Assessment and Plan  A: IUP based on [place] 11/07/2012 Abdominal pain in pregnancy   P: Discharge home Start prenatal care as soon as possible Pregnancy verification letter given  Ok to take tylenol as directed on the bottle for pain Support given Return to MAU if symptoms worsen   RASCH, JENNIFER IRENE FNP-C 11/07/2012, 2:43 PM

## 2012-11-07 NOTE — MAU Note (Signed)
PT presents with complaints of pelvic pain. Pt was evaluated at [facility] urgent care and had a +  Pregnancy test and was sent here to MAU to R/O ectopic pregnancy.

## 2012-11-07 NOTE — ED Provider Notes (Signed)
CSN: [id number]     Arrival date & time 11/07/12  0932 History   First MD Initiated Contact with Patient 11/07/12 1005     Chief Complaint  Patient presents with  . Possible Pregnancy   (Consider location/radiation/quality/duration/timing/severity/associated sxs/prior Treatment) HPI Comments: 32 year old female patient is here requesting a CPS. She is amenorrheic for 2 months. She is complaining of mild right pelvic pain no vaginal bleeding. Is also complaining of nausea without vomiting and feeling of tiredness.   History reviewed. No pertinent past medical history. Past Surgical History  Procedure Laterality Date  . Cholecystectomy     History reviewed. No pertinent family history. History  Substance Use Topics  . Smoking status: Never Smoker   . Smokeless tobacco: Not on file  . Alcohol Use: No   OB History   Grav Para Term Preterm Abortions TAB SAB Ect Mult Living                 Review of Systems  Constitutional: Positive for fatigue. Negative for fever.  HENT: Negative.   Respiratory: Negative.   Cardiovascular: Negative.   Gastrointestinal: Positive for nausea. Negative for vomiting and blood in stool.  Genitourinary: Positive for frequency and pelvic pain.  Skin: Negative.   Neurological: Negative.     Allergies  Review of patient's allergies indicates no known allergies.  Home Medications   Current Outpatient Rx  Name  Route  Sig  Dispense  Refill  . acetaminophen (TYLENOL) 325 MG tablet   Oral   Take 650 mg by mouth every 6 (six) hours as needed.         Marland Kitchen EXPIRED: loratadine (CLARITIN) 10 MG tablet   Oral   Take 1 tablet (10 mg total) by mouth daily.   30 tablet   3    BP 112/66  Pulse 75  Temp(Src) 98.1 F (36.7 C) (Oral)  Resp 16  SpO2 96%  LMP 09/08/2012 Physical Exam  Nursing note and vitals reviewed. Constitutional: She is oriented to person, place, and time. She appears well-developed and well-nourished. No distress.  Eyes: EOM are  normal.  Neck: Normal range of motion. Neck supple.  Cardiovascular: Normal rate, regular rhythm and normal heart sounds.   Pulmonary/Chest: Effort normal and breath sounds normal. No respiratory distress. She has no wheezes.  Abdominal: Soft. Bowel sounds are normal. She exhibits no distension and no mass. There is no tenderness. There is no rebound and no guarding.  Genitourinary:   Manual pressure applied to the suprapubic and bilateral pelvic areas reveals persistent reproducible but mild tenderness in the right pelvis.  Musculoskeletal: She exhibits no edema.  Neurological: She is alert and oriented to person, place, and time. She exhibits normal muscle tone.  Skin: Skin is warm and dry. No erythema.  Psychiatric: She has a normal mood and affect.    ED Course  Procedures (including critical care time) Labs Review Labs Reviewed  POCT PREGNANCY, URINE - Abnormal; Notable for the following:    Preg Test, Ur POSITIVE (*)    All other components within normal limits  POCT URINALYSIS DIP (DEVICE) - Abnormal; Notable for the following:    Glucose, UA 100 (*)    All other components within normal limits   Imaging Review No results found.  MDM   1. Positive pregnancy test   2. Amenorrhea   3. Pelvic pain in pregnancy      Pt is recommended to go to the [facility] hospiatl for right pelvic pain and  newly obtained positive pregnancy test. The pain is mild and with mild tenderness. No distress and the pt is stable. [facility] T[facility] Nurse called and report given.  Hayden Rasmussen, NP 11/07/12 1037  Hayden Rasmussen, NP 11/07/12 1045  Hayden Rasmussen, NP 11/07/12 1045

## 2012-11-07 NOTE — MAU Provider Note (Signed)
Attestation of Attending Supervision of Advanced Practitioner (PA/CNM/NP): Evaluation and management procedures were performed by the Advanced Practitioner under my supervision and collaboration.  I have reviewed the Advanced Practitioner's note and chart, and I agree with the management and plan.  Tandrea Kommer, MD, FACOG Attending Obstetrician & Gynecologist Faculty Practice, [facility]  

## 2012-11-07 NOTE — ED Notes (Signed)
Patient states no period in 2 months.  She also complains of pelvis discomfort and nausea.  Patient does not have a doctor.

## 2012-11-08 NOTE — ED Provider Notes (Signed)
Medical screening examination/treatment/procedure(s) were performed by a resident physician or non-physician practitioner and as the supervising physician I was immediately available for consultation/collaboration.  Anjolaoluwa Siguenza, MD    Jedediah Noda S Longino Trefz, MD 11/08/12 0846 

## 2012-12-17 ENCOUNTER — Emergency Department (HOSPITAL_COMMUNITY)
Admission: EM | Admit: 2012-12-17 | Discharge: 2012-12-17 | Disposition: A | Discharge status: Home / Self Care | Payer: No Typology Code available for payment source | Attending: Emergency Medicine | Admitting: Emergency Medicine

## 2012-12-17 DIAGNOSIS — O9989 Other specified diseases and conditions complicating pregnancy, childbirth and the puerperium: Secondary | ICD-10-CM | POA: Insufficient documentation

## 2012-12-17 DIAGNOSIS — Y92410 Unspecified street and highway as the place of occurrence of the external cause: Secondary | ICD-10-CM | POA: Insufficient documentation

## 2012-12-17 DIAGNOSIS — Z3490 Encounter for supervision of normal pregnancy, unspecified, unspecified trimester: Secondary | ICD-10-CM

## 2012-12-17 DIAGNOSIS — S3981XA Other specified injuries of abdomen, initial encounter: Secondary | ICD-10-CM | POA: Insufficient documentation

## 2012-12-17 DIAGNOSIS — Y9389 Activity, other specified: Secondary | ICD-10-CM | POA: Insufficient documentation

## 2012-12-17 MED ORDER — IBUPROFEN 200 MG PO TABS
600.0000 mg | ORAL_TABLET | Freq: Once | ORAL | Status: AC
  Administered 2012-12-17: 600 mg via ORAL
  Filled 2012-12-17: qty 3

## 2012-12-17 MED ORDER — ACETAMINOPHEN 500 MG PO TABS: 1000.0000 mg | ORAL_TABLET | Freq: Once | ORAL | Status: DC
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
## 2012-12-17 NOTE — ED Notes (Signed)
PT continues to c/o some lower abd pain.

## 2012-12-17 NOTE — ED Notes (Signed)
Dr. Ward at bedside.
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
## 2012-12-24 ENCOUNTER — Other Ambulatory Visit (HOSPITAL_COMMUNITY): Payer: Self-pay | Admitting: Family

## 2012-12-24 DIAGNOSIS — Z3689 Encounter for other specified antenatal screening: Secondary | ICD-10-CM

## 2012-12-24 LAB — OB RESULTS CONSOLE GC/CHLAMYDIA
Chlamydia: NEGATIVE
GC PROBE AMP, GENITAL: NEGATIVE

## 2012-12-30 ENCOUNTER — Ambulatory Visit (HOSPITAL_COMMUNITY)
Admission: RE | Admit: 2012-12-30 | Discharge: 2012-12-30 | Disposition: A | Discharge status: Home / Self Care | Payer: No Typology Code available for payment source | Source: Ambulatory Visit | Attending: Family | Admitting: Family

## 2012-12-30 ENCOUNTER — Other Ambulatory Visit (HOSPITAL_COMMUNITY): Payer: Self-pay | Admitting: Family

## 2012-12-30 DIAGNOSIS — Z3689 Encounter for other specified antenatal screening: Secondary | ICD-10-CM | POA: Insufficient documentation

## 2013-02-25 NOTE — L&D Delivery Note (Signed)
Delivery Note At 9:46 PM a healthy female was delivered via Vaginal, Spontaneous Delivery (Presentation:ROA).  APGAR: 9, 9; weight to be determined .   Placenta status: Intact, Spontaneous.  Cord: 3 vessels with the following complications: None.  Cord pH: N/A  Anesthesia: None  Episiotomy: None Lacerations: None Suture Repair: none Est. Blood Loss (mL): 400  Mom to postpartum.  Baby to Couplet care / Skin to Skin.  NSVD of a healthy baby boy over an intact perineum. Baby was placed on mother's abdomen and cord was cut in the usual fashion. Third stage of labor was managed actively with pitocin and traction. Some moderate bleeding with fundal massage so 1000 mcg cytotec was placed PR.   Kevin FentonBradshaw, Samuel 04/20/2013, 11:02 PM

## 2013-02-25 NOTE — L&D Delivery Note (Signed)
Baby delivered as CNM coming in to room. Infant vigorous. I was present for delivery of placenta and agree with above. Bleeding scant after pitocin and cytotec. FF, U/3  [place] Jason Hauge, CNM 04/20/2013 11:08 PM

## 2013-04-02 IMAGING — CT CT ABD-PELV W/O CM
2 of 4 series · 17 of 46 positions shown, 19 images · non-contrast
Comparison: 07/21/2007

CLINICAL DATA: Right-sided flank and abdominal pain.

CT ABDOMEN AND PELVIS WITHOUT CONTRAST
TECHNIQUE: Multidetector CT imaging of the abdomen and pelvis was
performed following the standard protocol without intravenous
contrast.

[Series 2: stone study · axial · 0.64mm/px · z∈[-398,-33]mm · 14 of 81 slices shown, 16 images]
[im 4/81  soft-tissue]
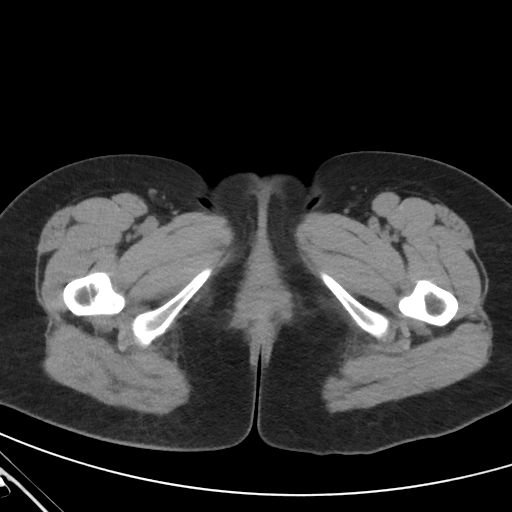
[im 4/81  bone]
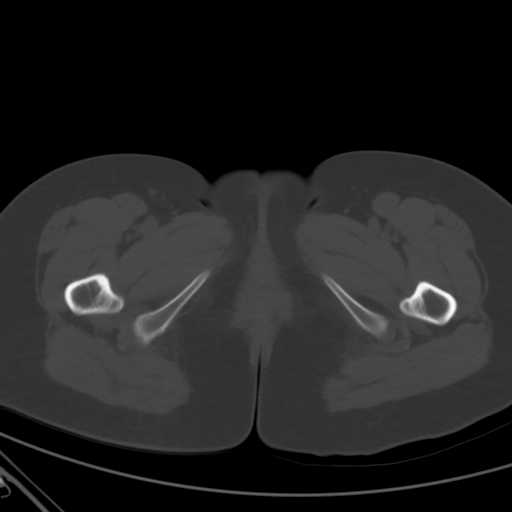
[im 10/81  soft-tissue]
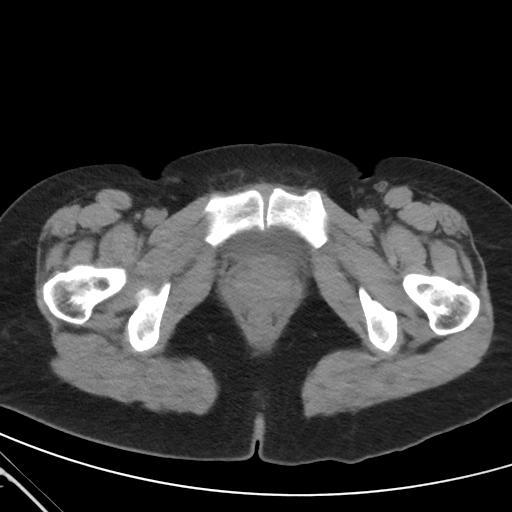
[im 17/81  soft-tissue]
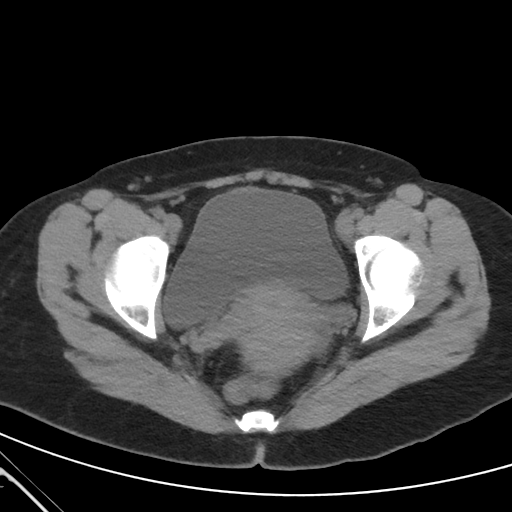
[im 23/81  soft-tissue]
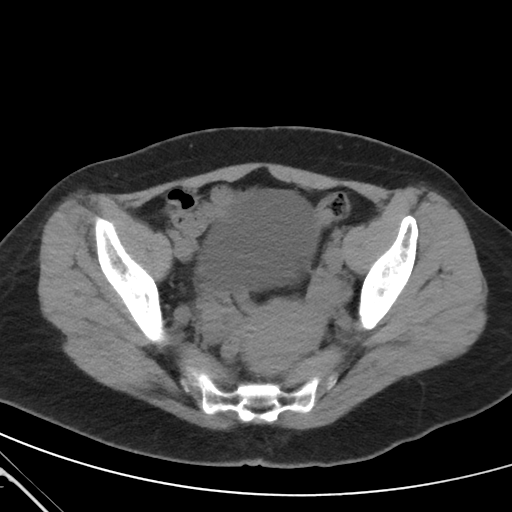
[im 26/81  soft-tissue]
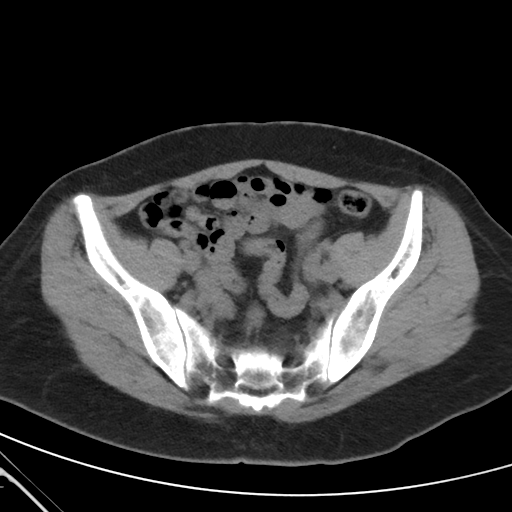
[im 33/81  soft-tissue]
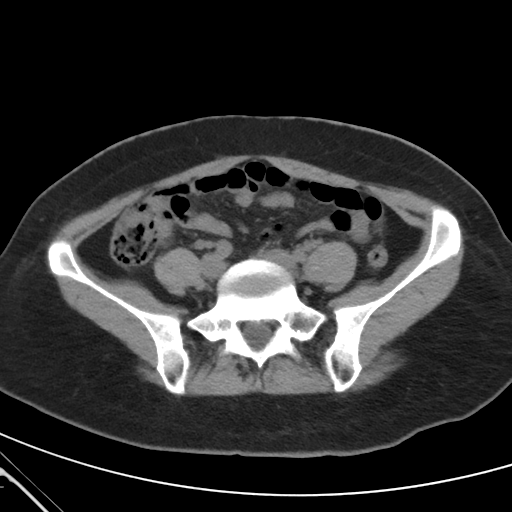
[im 39/81  soft-tissue]
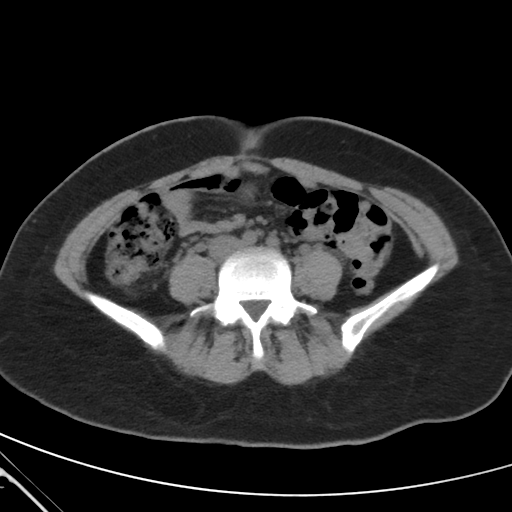
[im 42/81  soft-tissue]
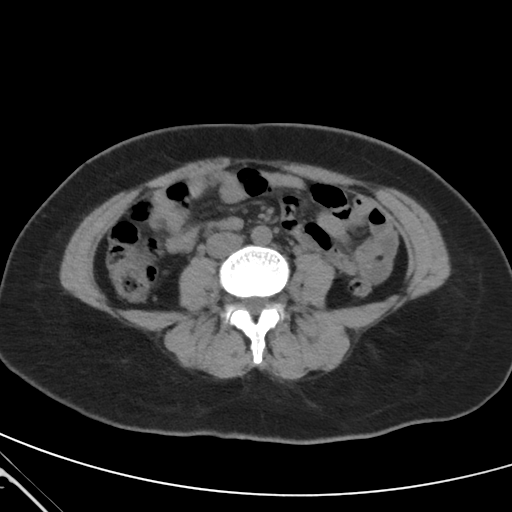
[im 49/81  soft-tissue]
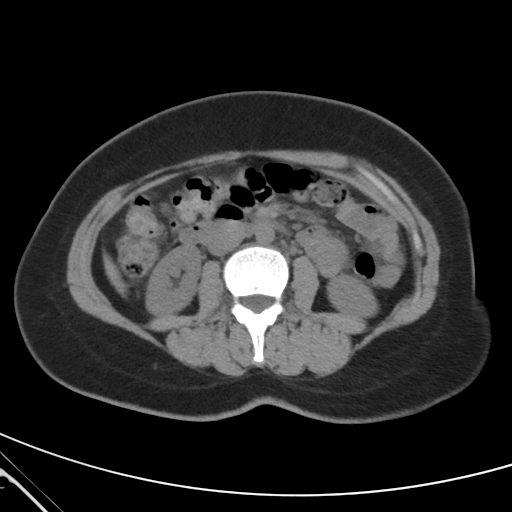
[im 49/81  bone]
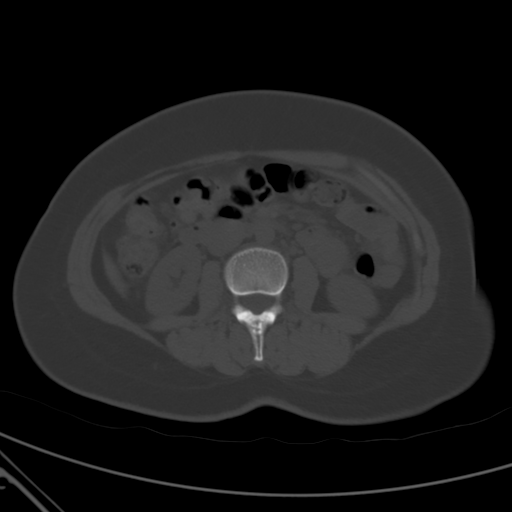
[im 55/81  soft-tissue]
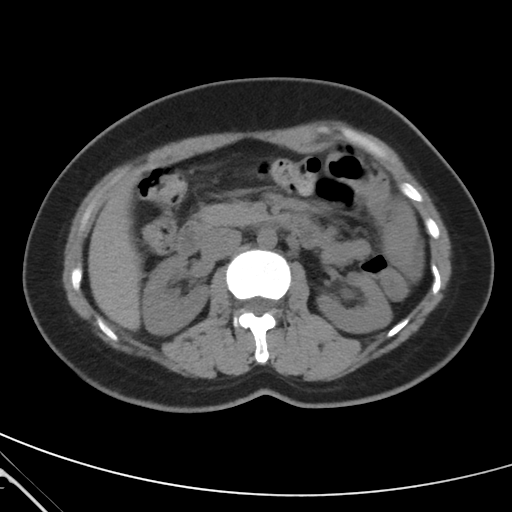
[im 61/81  soft-tissue]
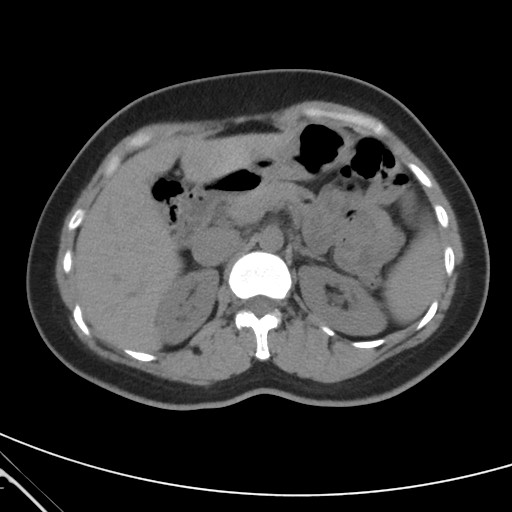
[im 65/81  soft-tissue]
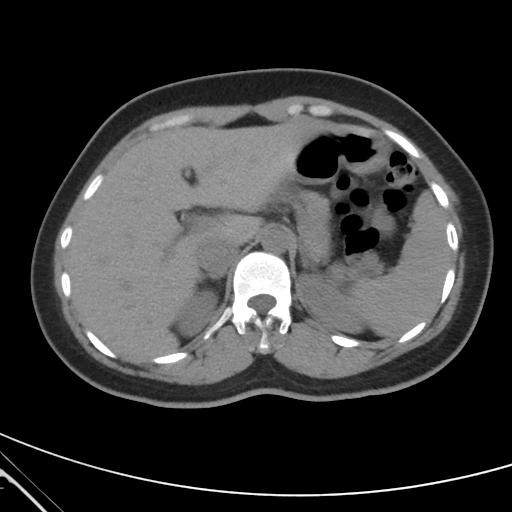
[im 71/81  soft-tissue]
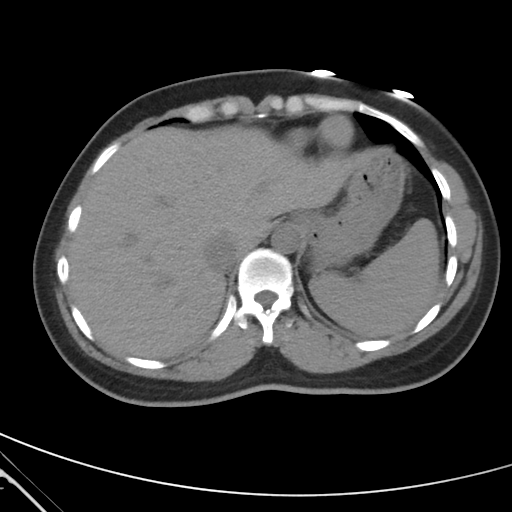
[im 77/81  soft-tissue]
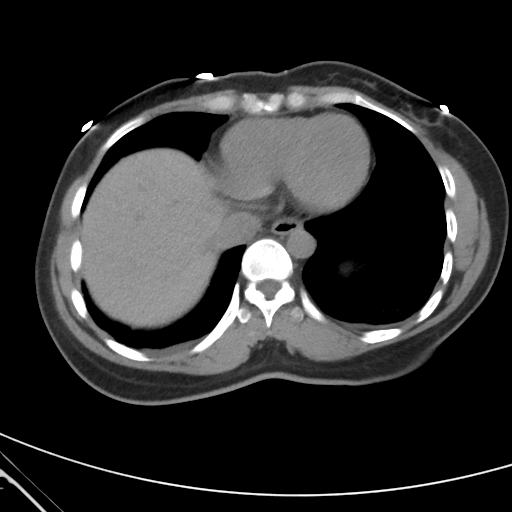

[mpr, coronals, coronal · coronal · 0.78mm/px · 3 of 94 slices shown]
[im 32/94  soft-tissue]
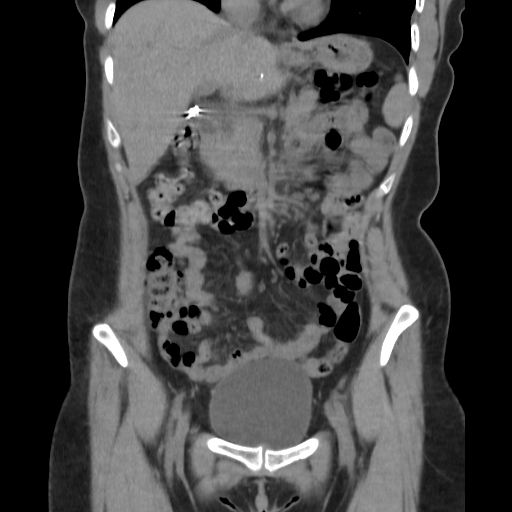
[im 42/94  soft-tissue]
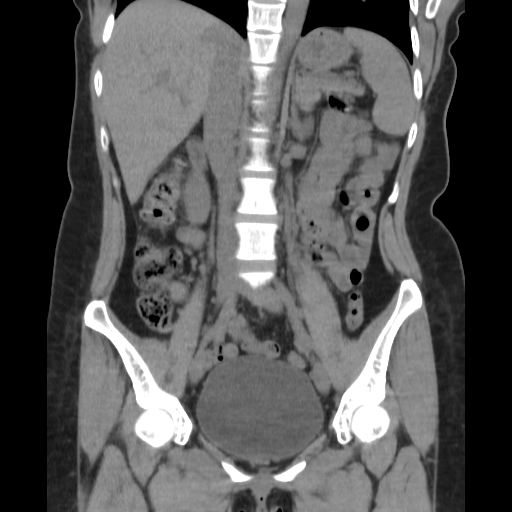
[im 52/94  soft-tissue]
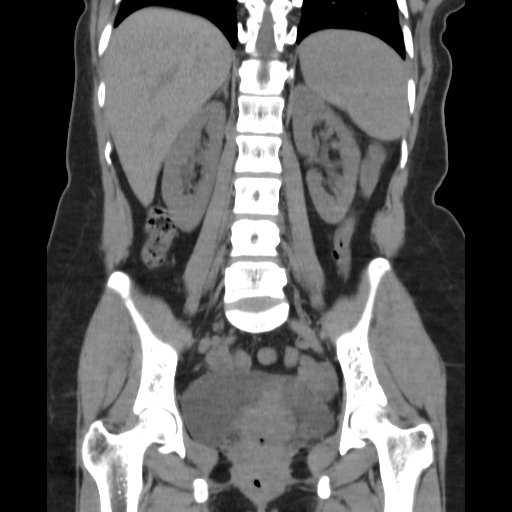

[17 of 46 positions shown; findings below may reference images not displayed]

FINDINGS: Mild bibasilar atelectasis.  Otherwise lung bases are
clear.  Heart size is within normal limits.  No pleural or
pericardial effusion.

Evaluation of the organs within the abdomen is limited without
intravenous contrast.  Within this limitation, unremarkable liver,
spleen, pancreas, and adrenal glands.  Status post cholecystectomy.
No biliary ductal dilatation.

The kidneys are symmetric in size.  No urinary tract calculi
identified.  No hydronephrosis or hydroureter.

The bladder is distended.  The uterus and adnexa are within normal
limits for noncontrast CT.

No bowel obstruction or CT evidence for colitis.  The appendix is
within normal limits.

No free intraperitoneal air or fluid.  No lymphadenopathy.

Normal caliber vasculature.

No aggressive osseous abnormality.
IMPRESSION: No hydronephrosis or urinary tract calculi identified.

Normal appendix.

## 2013-04-15 LAB — OB RESULTS CONSOLE GBS: STREP GROUP B AG: NEGATIVE

## 2013-04-20 ENCOUNTER — Inpatient Hospital Stay (HOSPITAL_COMMUNITY)
Admission: AD | Admit: 2013-04-20 | Discharge: 2013-04-22 | DRG: 774 | Disposition: A | Discharge status: Home / Self Care | Payer: Medicaid Other | Source: Ambulatory Visit | Attending: Family Medicine | Admitting: Family Medicine

## 2013-04-20 ENCOUNTER — Encounter (HOSPITAL_COMMUNITY): Payer: Self-pay | Admitting: *Deleted

## 2013-04-20 DIAGNOSIS — O4690 Antepartum hemorrhage, unspecified, unspecified trimester: Secondary | ICD-10-CM | POA: Diagnosis present

## 2013-04-20 DIAGNOSIS — O4290 Premature rupture of membranes, unspecified as to length of time between rupture and onset of labor, unspecified weeks of gestation: Secondary | ICD-10-CM

## 2013-04-20 LAB — CBC
HCT: 34.8 % — ABNORMAL LOW (ref 36.0–46.0)
HEMOGLOBIN: 11.7 g/dL — AB (ref 12.0–15.0)
MCH: 24.9 pg — ABNORMAL LOW (ref 26.0–34.0)
MCHC: 33.6 g/dL (ref 30.0–36.0)
MCV: 74.2 fL — ABNORMAL LOW (ref 78.0–100.0)
PLATELETS: 151 10*3/uL (ref 150–400)
RBC: 4.69 MIL/uL (ref 3.87–5.11)
RDW: 14 % (ref 11.5–15.5)
WBC: 10.3 10*3/uL (ref 4.0–10.5)

## 2013-04-20 LAB — TYPE AND SCREEN
ABO/RH(D): A POS
Antibody Screen: NEGATIVE

## 2013-04-20 LAB — OB RESULTS CONSOLE RUBELLA ANTIBODY, IGM: RUBELLA: IMMUNE

## 2013-04-20 LAB — OB RESULTS CONSOLE RPR: RPR: NONREACTIVE

## 2013-04-20 LAB — OB RESULTS CONSOLE ANTIBODY SCREEN: Antibody Screen: NEGATIVE

## 2013-04-20 LAB — OB RESULTS CONSOLE ABO/RH: RH Type: POSITIVE

## 2013-04-20 LAB — OB RESULTS CONSOLE HIV ANTIBODY (ROUTINE TESTING): HIV: NONREACTIVE

## 2013-04-20 LAB — RPR: RPR Ser Ql: NONREACTIVE

## 2013-04-20 LAB — OB RESULTS CONSOLE HEPATITIS B SURFACE ANTIGEN: Hepatitis B Surface Ag: NEGATIVE

## 2013-04-20 MED ORDER — PRENATAL MULTIVITAMIN CH
1.0000 | ORAL_TABLET | Freq: Every day | ORAL | Status: DC
  Administered 2013-04-21: 1 via ORAL
  Filled 2013-04-20 (×2): qty 1

## 2013-04-20 MED ORDER — TETANUS-DIPHTH-ACELL PERTUSSIS 5-2.5-18.5 LF-MCG/0.5 IM SUSP: 0.5000 mL | Freq: Once | INTRAMUSCULAR | Status: DC

## 2013-04-20 MED ORDER — OXYCODONE-ACETAMINOPHEN 5-325 MG PO TABS
1.0000 | ORAL_TABLET | ORAL | Status: DC | PRN
  Administered 2013-04-21 – 2013-04-22 (×2): 1 via ORAL
  Filled 2013-04-20 (×2): qty 1

## 2013-04-20 MED ORDER — SIMETHICONE 80 MG PO CHEW: 80.0000 mg | CHEWABLE_TABLET | ORAL | Status: DC | PRN

## 2013-04-20 MED ORDER — ZOLPIDEM TARTRATE 5 MG PO TABS: 5.0000 mg | ORAL_TABLET | Freq: Every evening | ORAL | Status: DC | PRN

## 2013-04-20 MED ORDER — MISOPROSTOL 200 MCG PO TABS
1000.0000 ug | ORAL_TABLET | Freq: Once | ORAL | Status: AC
  Administered 2013-04-20: 1000 ug via RECTAL

## 2013-04-20 MED ORDER — DIPHENHYDRAMINE HCL 25 MG PO CAPS: 25.0000 mg | ORAL_CAPSULE | Freq: Four times a day (QID) | ORAL | Status: DC | PRN

## 2013-04-20 MED ORDER — WITCH HAZEL-GLYCERIN EX PADS: 1.0000 "application " | MEDICATED_PAD | CUTANEOUS | Status: DC | PRN

## 2013-04-20 MED ORDER — LACTATED RINGERS IV SOLN
INTRAVENOUS | Status: DC
  Administered 2013-04-20 (×2): via INTRAVENOUS

## 2013-04-20 MED ORDER — MISOPROSTOL 200 MCG PO TABS
ORAL_TABLET | ORAL | Status: AC
  Filled 2013-04-20: qty 5

## 2013-04-20 MED ORDER — METHYLERGONOVINE MALEATE 0.2 MG PO TABS: 0.2000 mg | ORAL_TABLET | ORAL | Status: DC | PRN

## 2013-04-20 MED ORDER — IBUPROFEN 600 MG PO TABS
600.0000 mg | ORAL_TABLET | Freq: Four times a day (QID) | ORAL | Status: DC
  Administered 2013-04-21 – 2013-04-22 (×6): 600 mg via ORAL
  Filled 2013-04-20 (×6): qty 1

## 2013-04-20 MED ORDER — METHYLERGONOVINE MALEATE 0.2 MG/ML IJ SOLN: 0.2000 mg | INTRAMUSCULAR | Status: DC | PRN

## 2013-04-20 MED ORDER — DIBUCAINE 1 % RE OINT: 1.0000 "application " | TOPICAL_OINTMENT | RECTAL | Status: DC | PRN

## 2013-04-20 MED ORDER — ONDANSETRON HCL 4 MG PO TABS: 4.0000 mg | ORAL_TABLET | ORAL | Status: DC | PRN

## 2013-04-20 MED ORDER — IBUPROFEN 600 MG PO TABS
600.0000 mg | ORAL_TABLET | Freq: Four times a day (QID) | ORAL | Status: DC | PRN
  Administered 2013-04-20: 600 mg via ORAL
  Filled 2013-04-20: qty 1

## 2013-04-20 MED ORDER — OXYTOCIN BOLUS FROM INFUSION
500.0000 mL | INTRAVENOUS | Status: DC
  Administered 2013-04-20: 500 mL via INTRAVENOUS

## 2013-04-20 MED ORDER — OXYCODONE-ACETAMINOPHEN 5-325 MG PO TABS: 1.0000 | ORAL_TABLET | ORAL | Status: DC | PRN

## 2013-04-20 MED ORDER — SENNOSIDES-DOCUSATE SODIUM 8.6-50 MG PO TABS
2.0000 | ORAL_TABLET | ORAL | Status: DC
  Administered 2013-04-21: 2 via ORAL
  Filled 2013-04-20: qty 2

## 2013-04-20 MED ORDER — ACETAMINOPHEN 325 MG PO TABS
650.0000 mg | ORAL_TABLET | ORAL | Status: DC | PRN
  Administered 2013-04-20: 650 mg via ORAL
  Filled 2013-04-20: qty 2

## 2013-04-20 MED ORDER — ONDANSETRON HCL 4 MG/2ML IJ SOLN: 4.0000 mg | INTRAMUSCULAR | Status: DC | PRN

## 2013-04-20 MED ORDER — OXYTOCIN 40 UNITS IN LACTATED RINGERS INFUSION - SIMPLE MED
62.5000 mL/h | INTRAVENOUS | Status: DC
Start: 2013-04-20 — End: 2013-04-20
  Administered 2013-04-20: 62.5 mL/h via INTRAVENOUS
  Filled 2013-04-20: qty 1000

## 2013-04-20 MED ORDER — CITRIC ACID-SODIUM CITRATE 334-500 MG/5ML PO SOLN: 30.0000 mL | ORAL | Status: DC | PRN

## 2013-04-20 MED ORDER — MAGNESIUM HYDROXIDE 400 MG/5ML PO SUSP: 30.0000 mL | ORAL | Status: DC | PRN

## 2013-04-20 MED ORDER — LACTATED RINGERS IV SOLN: 500.0000 mL | INTRAVENOUS | Status: DC | PRN

## 2013-04-20 MED ORDER — MEASLES, MUMPS & RUBELLA VAC ~~LOC~~ INJ
0.5000 mL | INJECTION | Freq: Once | SUBCUTANEOUS | Status: DC
  Filled 2013-04-20: qty 0.5

## 2013-04-20 MED ORDER — LIDOCAINE HCL (PF) 1 % IJ SOLN
30.0000 mL | INTRAMUSCULAR | Status: DC | PRN
  Filled 2013-04-20: qty 30

## 2013-04-20 MED ORDER — BENZOCAINE-MENTHOL 20-0.5 % EX AERO: 1.0000 "application " | INHALATION_SPRAY | CUTANEOUS | Status: DC | PRN

## 2013-04-20 MED ORDER — FLEET ENEMA 7-19 GM/118ML RE ENEM: 1.0000 | ENEMA | RECTAL | Status: DC | PRN

## 2013-04-20 MED ORDER — FERROUS SULFATE 325 (65 FE) MG PO TABS
325.0000 mg | ORAL_TABLET | Freq: Two times a day (BID) | ORAL | Status: DC
  Administered 2013-04-21 – 2013-04-22 (×3): 325 mg via ORAL
  Filled 2013-04-20 (×3): qty 1

## 2013-04-20 MED ORDER — LANOLIN HYDROUS EX OINT: 1.0000 "application " | TOPICAL_OINTMENT | CUTANEOUS | Status: DC | PRN

## 2013-04-20 MED ORDER — ONDANSETRON HCL 4 MG/2ML IJ SOLN
4.0000 mg | Freq: Four times a day (QID) | INTRAMUSCULAR | Status: DC | PRN
Start: 2013-04-20 — End: 2013-04-20

## 2013-04-20 NOTE — MAU Provider Note (Signed)
Hayley Moore is a 33 y.o. female presenting for bleeding. Maternal Medical History:  Reason for admission: Vaginal bleeding.   Contractions: Onset was 1-2 hours ago.   Frequency: regular.   Duration is approximately 3 minutes.   Perceived severity is mild.    Fetal activity: Perceived fetal activity is normal.    Prenatal complications: Bleeding.   Prenatal Complications - Diabetes: none.    OB History   Grav Para Term Preterm Abortions TAB SAB Ect Mult Living   4 3 3       3      History reviewed. No pertinent past medical history. Past Surgical History  Procedure Laterality Date  . Cholecystectomy     Family History: family history is not on file. Social History:  reports that she has never smoked. She does not have any smokeless tobacco history on file. She reports that she does not drink alcohol or use illicit drugs.   Prenatal Transfer Tool  Maternal Diabetes: No Genetic Screening: Normal Maternal Ultrasounds/Referrals: Normal Fetal Ultrasounds or other Referrals:  None Maternal Substance Abuse:  No Significant Maternal Medications:  None Significant Maternal Lab Results:  Lab values include: Group B Strep negative Other Comments:  None  Review of Systems  Constitutional: Negative for fever and chills.  Respiratory: Negative for cough.   Cardiovascular: Negative for chest pain.  Genitourinary: Negative for dysuria.  Psychiatric/Behavioral: Negative for substance abuse.    Dilation: 4 Effacement (%): 50 Station: -2 Exam by:: Dorrene GermanJ. Lowe RN Blood pressure 109/65, pulse 76, temperature 97.9 F (36.6 C), temperature source Oral, resp. rate 18, height 5' (1.524 m), weight 135 lb (61.236 kg), last menstrual period 08/04/2012. Maternal Exam:  Uterine Assessment: Contraction strength is mild.  Contraction duration is 30 seconds. Contraction frequency is regular.   Abdomen: Fetal presentation: vertex  Introitus: Normal vulva. Normal vagina.  Pelvis: adequate for  delivery.   Cervix: Cervix evaluated by digital exam.     Fetal Exam Fetal Monitor Review: Mode: ultrasound.   Baseline rate: 125.  Variability: marked (>25 bpm).   Pattern: accelerations present.    Fetal State Assessment: Category I - tracings are normal.    Dilation: 4 Effacement (%): 50 Cervical Position: Middle Station: -2 Presentation: Vertex Exam by:: Dorrene GermanJ. Lowe RN  Physical Exam  Vitals reviewed. Constitutional: She is oriented to person, place, and time. She appears well-developed and well-nourished. No distress.  HENT:  Head: Normocephalic and atraumatic.  Eyes: No scleral icterus.  Neck: Neck supple.  Cardiovascular: Normal rate.   Respiratory: Effort normal.  GI: Soft. There is no tenderness.  Neurological: She is alert and oriented to person, place, and time.  Skin: Skin is warm and dry. No rash noted.  Psychiatric: She has a normal mood and affect.    Prenatal labs: ABO, Rh: --/--/A POS (09/13 1155) Antibody:  Neg Rubella:  Immune RPR:   neg HBsAg:   neg HIV:   neg GBS:   neg--verbal report from [facility]  Assessment/Plan: Vaginal bleeding ? Early labor Watch for 1 hour and re-check--will likely be admitted if continued bleeding, whether in labor or not   Hayley Moore S 04/20/2013, 10:51 AM

## 2013-04-20 NOTE — Progress Notes (Signed)
Using an interpreter by phone that the patient has used for dr appointments.  [employer] was unable to find an interpreter that speaks [place].

## 2013-04-20 NOTE — H&P (Signed)
Hayley Moore is a 33 y.o. female presenting for bleeding.  Maternal Medical History:  Reason for admission: Vaginal bleeding.   Contractions: Onset was 1-2 hours ago.  Frequency: regular.  Duration is approximately 3 minutes.  Perceived severity is mild.  Fetal activity: Perceived fetal activity is normal.  Prenatal complications: Bleeding.  Prenatal Complications - Diabetes: none.   OB History    Grav  Para  Term  Preterm  Abortions  TAB  SAB  Ect  Mult  Living    4  3  3        3       History reviewed. No pertinent past medical history.  Past Surgical History   Procedure  Laterality  Date   .  Cholecystectomy      Family History: family history is not on file.  Social History: reports that she has never smoked. She does not have any smokeless tobacco history on file. She reports that she does not drink alcohol or use illicit drugs.  Prenatal Transfer Tool   Maternal Diabetes: No  Genetic Screening: Normal  Maternal Ultrasounds/Referrals: Normal  Fetal Ultrasounds or other Referrals: None  Maternal Substance Abuse: No  Significant Maternal Medications: None  Significant Maternal Lab Results: Lab values include: Group B Strep negative  Other Comments: None  Review of Systems  Constitutional: Negative for fever and chills.  Respiratory: Negative for cough.  Cardiovascular: Negative for chest pain.  Genitourinary: Negative for dysuria.  Psychiatric/Behavioral: Negative for substance abuse.   Dilation: 4  Effacement (%): 50  Station: -2  Exam by:: Dorrene GermanJ. Lowe RN  Blood pressure 109/65, pulse 76, temperature 97.9 F (36.6 C), temperature source Oral, resp. rate 18, height 5' (1.524 m), weight 135 lb (61.236 kg), last menstrual period 08/04/2012.  Maternal Exam:  Uterine Assessment: Contraction strength is mild. Contraction duration is 30 seconds. Contraction frequency is regular.  Abdomen: Fetal presentation: vertex  Introitus: Normal vulva. Normal vagina. Pelvis: adequate for  delivery.  Cervix: Cervix evaluated by digital exam.  Fetal Exam  Fetal Monitor Review: Mode: ultrasound.  Baseline rate: 125.  Variability: marked (>25 bpm).  Pattern: accelerations present.  Fetal State Assessment: Category I - tracings are normal.  Dilation: 4  Effacement (%): 50  Cervical Position: Middle  Station: -2  Presentation: Vertex  Exam by:: Dorrene GermanJ. Lowe RN  Physical Exam  Vitals reviewed.  Constitutional: She is oriented to person, place, and time. She appears well-developed and well-nourished. No distress.  HENT:  Head: Normocephalic and atraumatic.  Eyes: No scleral icterus.  Neck: Neck supple.  Cardiovascular: Normal rate.  Respiratory: Effort normal.  GI: Soft. There is no tenderness.  Neurological: She is alert and oriented to person, place, and time.  Skin: Skin is warm and dry. No rash noted.  Psychiatric: She has a normal mood and affect.   Prenatal labs:  ABO, Rh: --/--/A POS (09/13 1155)  Antibody: Neg  Rubella: Immune  RPR: neg  HBsAg: neg  HIV: neg  GBS: neg--verbal report from [facility]  Assessment/Plan:  Vaginal bleeding ? Early labor --+ fern--for admission Watch for 1 hour and re-check--will likely be admitted if continued bleeding, whether in labor or not  Cornell Gaber S  04/20/2013, 10:51 AM

## 2013-04-20 NOTE — Progress Notes (Signed)
[employer] called and no [place] interpreter was available at this time. "Gasper SellsBuch Ksor" the patients friend was called to interpret.

## 2013-04-20 NOTE — MAU Note (Signed)
Vaginal bleeding noted this morning- no hx of any bleeding with preg, denies recent intercourse or exam.  Denies low lying or previa.  Denies pain.

## 2013-04-20 NOTE — Progress Notes (Signed)
Charolotte EkeHthuyen Rayburn is a 33 y.o. [id number] at 5244w0d admitted for rupture of membranes  Subjective: Pt having ctx, pain mild doing well.  Objective: BP 107/63  Pulse 70  Temp(Src) 97.6 F (36.4 C) (Oral)  Resp 18  Ht 5' (1.524 m)  Wt 61.236 kg (135 lb)  BMI 26.37 kg/m2  LMP 08/04/2012      FHT:  FHR: 130 bpm, variability: moderate,  accelerations:  Present,  decelerations:  Present 1 variable decel. overall reassuring UC:   regular, every 2-3 minutes SVE:   Dilation: 6 Effacement (%): 50 Station: -1 Exam by:: dr Ike Benedom  Labs: Lab Results  Component Value Date   WBC 10.3 04/20/2013   HGB 11.7* 04/20/2013   HCT 34.8* 04/20/2013   MCV 74.2* 04/20/2013   PLT 151 04/20/2013    Assessment / Plan: Induction of labor due to PROM,  progressing well on pitocin  Labor: Progressing on Pitocin, will continue to increase then AROM Preeclampsia:  no signs or symptoms of toxicity Fetal Wellbeing:  Category II Pain Control:  Fentanyl PRN I/D:  GBS Neg Anticipated MOD:  NSVD  Ferlando Lia RYAN 04/20/2013, 5:06 PM

## 2013-04-21 ENCOUNTER — Encounter: Payer: Self-pay | Admitting: *Deleted

## 2013-04-21 LAB — CBC
HCT: 32.4 % — ABNORMAL LOW (ref 36.0–46.0)
Hemoglobin: 11 g/dL — ABNORMAL LOW (ref 12.0–15.0)
MCH: 24.7 pg — ABNORMAL LOW (ref 26.0–34.0)
MCHC: 34 g/dL (ref 30.0–36.0)
MCV: 72.6 fL — ABNORMAL LOW (ref 78.0–100.0)
PLATELETS: 187 10*3/uL (ref 150–400)
RBC: 4.46 MIL/uL (ref 3.87–5.11)
RDW: 14.2 % (ref 11.5–15.5)
WBC: 17.4 10*3/uL — AB (ref 4.0–10.5)

## 2013-04-21 NOTE — Progress Notes (Signed)
Ur chart review completed.  

## 2013-04-21 NOTE — Progress Notes (Signed)
Post Partum Day 1, Interview conducted with the help of Gasper SellsBuch Ksor [phone], no Estanislado SpireJarai interpreter available at deliver last night and patient has signed consent for interpreter ion chart.  Subjective: no complaints, up ad lib, tolerating PO and states she has not voided yet.   Objective: Blood pressure 99/63, pulse 82, temperature 98.7 F (37.1 C), temperature source Oral, resp. rate 18, height 5' (1.524 m), weight 61.236 kg (135 lb), last menstrual period 08/04/2012, unknown if currently breastfeeding.  Physical Exam:  General: alert, cooperative and appears stated age Lochia: appropriate Uterine Fundus: firm Incision: n.a DVT Evaluation: No evidence of DVT seen on physical exam. Negative Homan's sign. No cords or calf tenderness.   Recent Labs  04/20/13 1248 04/21/13 0500  HGB 11.7* 11.0*  HCT 34.8* 32.4*    Assessment/Plan: Plan for discharge tomorrow, Breastfeeding and Contraception Condoms/None   LOS: 1 day   Kevin FentonBradshaw, Samuel 04/21/2013, 7:53 AM   I have seen and examined this patient and agree with above documentation in the resident's note.   Rulon AbideKeli Dajour [place], M.D. [facility] 04/21/2013 10:50 AM

## 2013-04-21 NOTE — Discharge Instructions (Signed)

## 2013-04-22 MED ORDER — IBUPROFEN 600 MG PO TABS: 600.0000 mg | ORAL_TABLET | Freq: Four times a day (QID) | ORAL | Status: DC

## 2013-04-22 NOTE — Lactation Note (Signed)
This note was copied from the chart of Hayley Moore. Lactation Consultation Note  Patient Name: Hayley Moore YNWGN'FToday's Date: 04/22/2013 Reason for consult: Follow-up assessment Infant observed at the breast. Mother has large nipples and baby shallow on the breast at times causing redness and soreness. Discussed use of comfort gels with the patient and her daughter who speaks [place]. Mother is an experienced breastfeeding mother with her other 3 children for 1 year each. Her breast are full and swallows noted. Care complete. Has lactation brochure and explained to family how to contact LC if needed.  Maternal Data    Feeding Feeding Type: Breast Fed Length of feed: 10 min  LATCH Score/Interventions Latch: Grasps breast easily, tongue down, lips flanged, rhythmical sucking.  Audible Swallowing: Spontaneous and intermittent  Type of Nipple: Everted at rest and after stimulation  Comfort (Breast/Nipple): Filling, red/small blisters or bruises, mild/mod discomfort  Problem noted: Mild/Moderate discomfort (redness and tender ) Interventions (Mild/moderate discomfort): Comfort gels  Hold (Positioning): No assistance needed to correctly position infant at breast.  LATCH Score: 9  Lactation Tools Discussed/Used     Consult Status Consult Status: Complete Follow-up type: Call as needed    Omar PersonDaly, Melville Engen M 04/22/2013, 12:36 PM

## 2013-04-22 NOTE — Discharge Summary (Signed)
Obstetric Discharge Summary Reason for Admission: rupture of membranes and & third tri vag bldg Prenatal Procedures: none Intrapartum Procedures: spontaneous vaginal delivery Postpartum Procedures: none Complications-Operative and Postpartum: none Hemoglobin  Date Value Ref Range Status  04/21/2013 11.0* 12.0 - 15.0 g/dL Final     HCT  Date Value Ref Range Status  04/21/2013 32.4* 36.0 - 46.0 % Final   Hayley Moore is a 33yo G4P3003 at 37.0wks who presents in the morning of 2/24 with vag bldg. She was noted to have +fern and was admitted and induced for PROM. Pitocin was started and she progressed to SVD that same evening. By PPD#2 she is deemed to have received the full benefit of her hospital stay and she will be discharged home. She is mostly pumping at this point and she is unsure re contraception. Her pre del hgb was 11.7 and post del 11.0.  Physical Exam:  General: alert, cooperative and no distress Heart: RRR Lungs: nl effort Lochia: appropriate Uterine Fundus: firm DVT Evaluation: No evidence of DVT seen on physical exam.  Discharge Diagnoses: Term Pregnancy-delivered  Discharge Information: Date: 04/22/2013 Activity: pelvic rest Diet: routine Medications: PNV and Ibuprofen Condition: stable Instructions: refer to practice specific booklet Discharge to: home Follow-up Information   [facility] [place] (Call for appt in 4 weeks)    Contact information:   [address] [place] [id number] [phone]      Newborn Data: Live born female  Birth Weight: 6 lb 9.3 oz (2985 g) APGAR: 9, 9  Home with mother.  Cam HaiSHAW, Hayley Pellow 04/22/2013, 7:42 AM

## 2013-04-22 NOTE — Lactation Note (Signed)
This note was copied from the chart of Hayley Moore. Lactation Consultation Note" Follow up visit with mom. She reports that baby has been latching well is asleep in her arms at present. Comfort gels given with instructions for use. Encouraged to page for assist at next feeding.   Patient Name: Hayley Charolotte EkeHthuyen Kasson WUJWJ'XToday's Date: 04/22/2013 Reason for consult: Follow-up assessment   Maternal Data    Feeding   LATCH Score/Interventions       Type of Nipple: Everted at rest and after stimulation (large)  Comfort (Breast/Nipple): Filling, red/small blisters or bruises, mild/mod discomfort  Problem noted: Mild/Moderate discomfort Interventions (Mild/moderate discomfort): Comfort gels        Lactation Tools Discussed/Used     Consult Status Consult Status: Follow-up    Pamelia HoitWeeks, Amilyah Nack D 04/22/2013, 9:45 AM

## 2013-04-24 ENCOUNTER — Other Ambulatory Visit: Payer: Self-pay | Admitting: Women's Health

## 2013-04-24 MED ORDER — IBUPROFEN 600 MG PO TABS: 600.0000 mg | ORAL_TABLET | Freq: Four times a day (QID) | ORAL | Status: AC | PRN

## 2013-04-24 NOTE — Progress Notes (Signed)
[facility], reports [facility] doesn't have her ibuprofen rx. Ibuprofen 600mg  q 6hr prn #30 w/ 0RF resent to pharmacy.   Hayley Moore, CNM, [facility] 04/24/2013 8:43 AM

## 2013-04-24 NOTE — Discharge Summary (Signed)
Attestation of Attending Supervision of Advanced Practitioner: Evaluation and management procedures were performed by the PA/NP/CNM/OB Fellow under my supervision/collaboration. Chart reviewed and agree with management and plan.  Kaysan Peixoto V 04/24/2013 7:54 PM     Attestation of Attending Supervision of Advanced Practitioner: Evaluation and management procedures were performed by the PA/NP/CNM/OB Fellow under my supervision/collaboration. Chart reviewed and agree with management and plan.  Tilda Burrow 04/24/2013 7:54 PM

## 2013-05-21 ENCOUNTER — Ambulatory Visit (INDEPENDENT_AMBULATORY_CARE_PROVIDER_SITE_OTHER): Payer: Medicaid Other | Admitting: Obstetrics and Gynecology

## 2013-05-21 ENCOUNTER — Encounter: Payer: Self-pay | Admitting: Obstetrics and Gynecology

## 2013-05-21 VITALS — BP 110/63 | HR 75 | Temp 97.8°F | Ht 59.0 in | Wt 123.0 lb

## 2013-05-21 DIAGNOSIS — Z124 Encounter for screening for malignant neoplasm of cervix: Secondary | ICD-10-CM

## 2013-05-21 NOTE — Progress Notes (Signed)
  Subjective:   [place] interpreter used  Hayley Moore is a 33 y.o. female [phone] [id number] who presents for a postpartum visit. She is 32 days postpartum following a spontaneous vaginal delivery. I have fully reviewed the prenatal and intrapartum course. The delivery was at 37 gestational weeks. Outcome: spontaneous vaginal delivery. Anesthesia: epidural. Postpartum course has been uncomplicted. Baby's course has been uncomplicted. Baby is feeding by breast. Bleeding no bleeding. Bowel function is normal. Bladder function is normal. Patient is not sexually active. Contraception method is abstinence. Postpartum depression screening: negative.  The following portions of the patient's history were reviewed and updated as appropriate: allergies, current medications, past family history, past medical history, past social history, past surgical history and problem list.  Review of Systems Pertinent items are noted in HPI.   Objective:    BP 110/63  Pulse 75  Temp(Src) 97.8 F (36.6 C) (Oral)  Ht 4\' 11"  (1.499 m)  Wt 123 lb (55.792 kg)  BMI 24.83 kg/m2  Breastfeeding? Yes  General:  alert, appears stated age and no distress   Breasts:  inspection negative, no nipple discharge or bleeding, no masses or nodularity palpable  Lungs: clear to auscultation bilaterally  Heart:  regular rate and rhythm, S1, S2 normal, no murmur, click, rub or gallop  Abdomen: soft, non-tender; bowel sounds normal; no masses,  no organomegaly   Vulva:  normal  Vagina: normal vagina small cystocele  Cervix:  no cervical motion tenderness and no lesions  Corpus: normal size, contour, position, consistency, mobility, non-tender  Adnexa:  not evaluated  Rectal Exam: Not performed.        Assessment:     1 month postpartum exam. Pap smear not done at today's visit.   Plan:    1. Contraception: condoms. Explained and recommended LARC. 2. [facility] month for Pap and contraceptive counseling 3. Follow up in: 1  year or as needed.  Continur PNV until 6 weeks

## 2013-05-21 NOTE — Patient Instructions (Signed)
Contraception Choices Contraception (birth control) is the use of any methods or devices to prevent pregnancy. Below are some methods to help avoid pregnancy. HORMONAL METHODS   Contraceptive implant This is a thin, plastic tube containing progesterone hormone. It does not contain estrogen hormone. Your health care provider inserts the tube in the inner part of the upper arm. The tube can remain in place for up to 3 years. After 3 years, the implant must be removed. The implant prevents the ovaries from releasing an egg (ovulation), thickens the cervical mucus to prevent sperm from entering the uterus, and thins the lining of the inside of the uterus.  Progesterone-only injections These injections are given every 3 months by your health care provider to prevent pregnancy. This synthetic progesterone hormone stops the ovaries from releasing eggs. It also thickens cervical mucus and changes the uterine lining. This makes it harder for sperm to survive in the uterus.  Birth control pills These pills contain estrogen and progesterone hormone. They work by preventing the ovaries from releasing eggs (ovulation). They also cause the cervical mucus to thicken, preventing the sperm from entering the uterus. Birth control pills are prescribed by a health care provider.Birth control pills can also be used to treat heavy periods.  Minipill This type of birth control pill contains only the progesterone hormone. They are taken every day of each month and must be prescribed by your health care provider.  Birth control patch The patch contains hormones similar to those in birth control pills. It must be changed once a week and is prescribed by a health care provider.  Vaginal ring The ring contains hormones similar to those in birth control pills. It is left in the vagina for 3 weeks, removed for 1 week, and then a new one is put back in place. The patient must be comfortable inserting and removing the ring from the  vagina.A health care provider's prescription is necessary.  Emergency contraception Emergency contraceptives prevent pregnancy after unprotected sexual intercourse. This pill can be taken right after sex or up to 5 days after unprotected sex. It is most effective the sooner you take the pills after having sexual intercourse. Most emergency contraceptive pills are available without a prescription. Check with your pharmacist. Do not use emergency contraception as your only form of birth control. BARRIER METHODS   Female condom This is a thin sheath (latex or rubber) that is worn over the penis during sexual intercourse. It can be used with spermicide to increase effectiveness.  Female condom. This is a soft, loose-fitting sheath that is put into the vagina before sexual intercourse.  Diaphragm This is a soft, latex, dome-shaped barrier that must be fitted by a health care provider. It is inserted into the vagina, along with a spermicidal jelly. It is inserted before intercourse. The diaphragm should be left in the vagina for 6 to 8 hours after intercourse.  Cervical cap This is a round, soft, latex or plastic cup that fits over the cervix and must be fitted by a health care provider. The cap can be left in place for up to 48 hours after intercourse.  Sponge This is a soft, circular piece of polyurethane foam. The sponge has spermicide in it. It is inserted into the vagina after wetting it and before sexual intercourse.  Spermicides These are chemicals that kill or block sperm from entering the cervix and uterus. They come in the form of creams, jellies, suppositories, foam, or tablets. They do not require a   prescription. They are inserted into the vagina with an applicator before having sexual intercourse. The process must be repeated every time you have sexual intercourse. INTRAUTERINE CONTRACEPTION  Intrauterine device (IUD) This is a T-shaped device that is put in a woman's uterus during a  menstrual period to prevent pregnancy. There are 2 types:  Copper IUD This type of IUD is wrapped in copper wire and is placed inside the uterus. Copper makes the uterus and fallopian tubes produce a fluid that kills sperm. It can stay in place for 10 years.  Hormone IUD This type of IUD contains the hormone progestin (synthetic progesterone). The hormone thickens the cervical mucus and prevents sperm from entering the uterus, and it also thins the uterine lining to prevent implantation of a fertilized egg. The hormone can weaken or kill the sperm that get into the uterus. It can stay in place for 3 5 years, depending on which type of IUD is used. PERMANENT METHODS OF CONTRACEPTION  Female tubal ligation This is when the woman's fallopian tubes are surgically sealed, tied, or blocked to prevent the egg from traveling to the uterus.  Hysteroscopic sterilization This involves placing a small coil or insert into each fallopian tube. Your doctor uses a technique called hysteroscopy to do the procedure. The device causes scar tissue to form. This results in permanent blockage of the fallopian tubes, so the sperm cannot fertilize the egg. It takes about 3 months after the procedure for the tubes to become blocked. You must use another form of birth control for these 3 months.  Female sterilization This is when the female has the tubes that carry sperm tied off (vasectomy).This blocks sperm from entering the vagina during sexual intercourse. After the procedure, the man can still ejaculate fluid (semen). NATURAL PLANNING METHODS  Natural family planning This is not having sexual intercourse or using a barrier method (condom, diaphragm, cervical cap) on days the woman could become pregnant.  Calendar method This is keeping track of the length of each menstrual cycle and identifying when you are fertile.  Ovulation method This is avoiding sexual intercourse during ovulation.  Symptothermal method This is  avoiding sexual intercourse during ovulation, using a thermometer and ovulation symptoms.  Post ovulation method This is timing sexual intercourse after you have ovulated. Regardless of which type or method of contraception you choose, it is important that you use condoms to protect against the transmission of sexually transmitted infections (STIs). Talk with your health care provider about which form of contraception is most appropriate for you. Document Released: 02/11/2005 Document Revised: 10/14/2012 Document Reviewed: 08/06/2012 [employer] Patient Information 2014 [employer].  

## 2013-12-27 ENCOUNTER — Encounter: Payer: Self-pay | Admitting: Obstetrics and Gynecology

## 2015-04-06 IMAGING — US US OB COMP LESS 14 WK
1 series · 14 of 17 positions shown · non-contrast
Comparison: CT abdomen/pelvis 11/04/2010

CLINICAL DATA: Pregnant, 1 week pelvic pain

OBSTETRIC <14 WK ULTRASOUND
TECHNIQUE: Transabdominal ultrasound was performed for evaluation
of the gestation as well as the maternal uterus and adnexal
regions.

[Series 1: us ob comp less 14 wks · 14 of 17 slices shown]
[im 1/17]
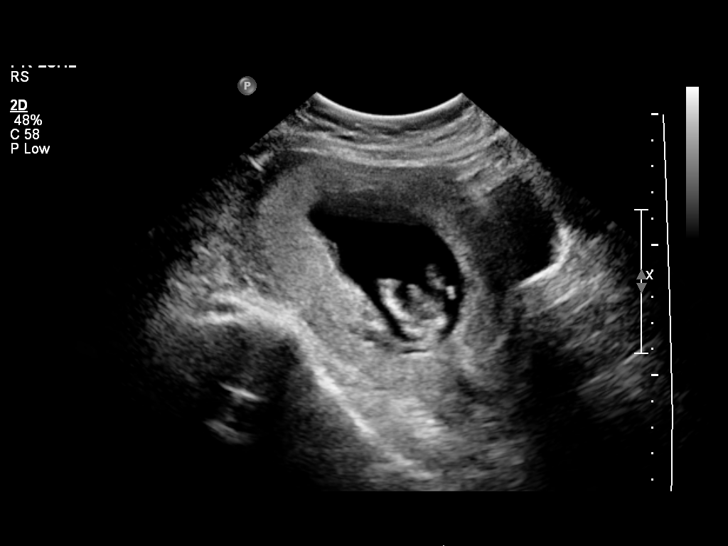
[im 2/17]
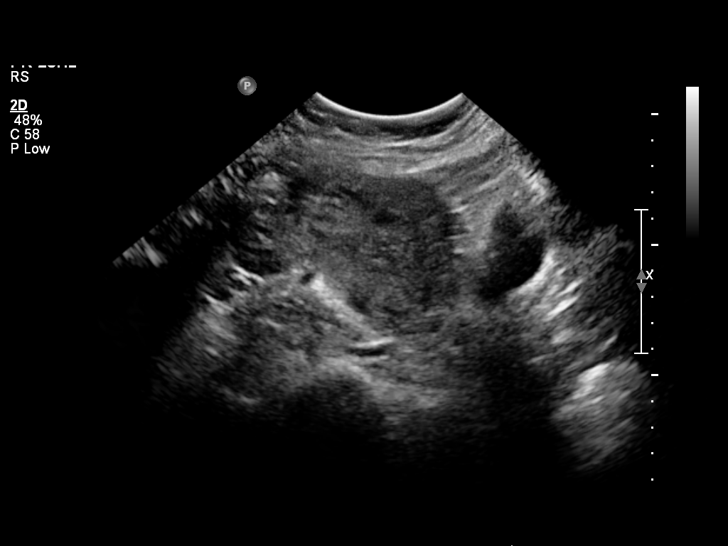
[im 4/17]
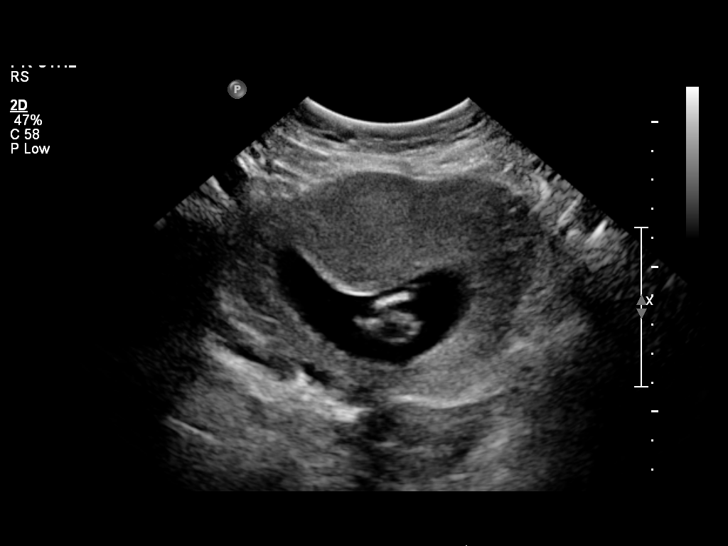
[im 5/17]
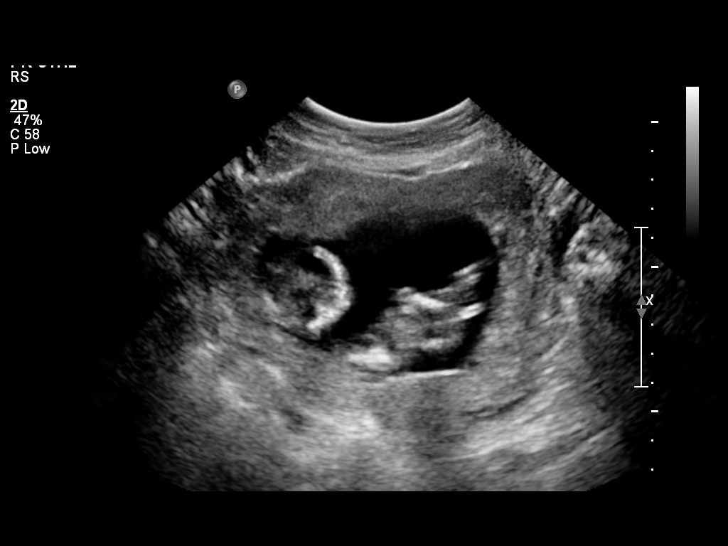
[im 6/17]
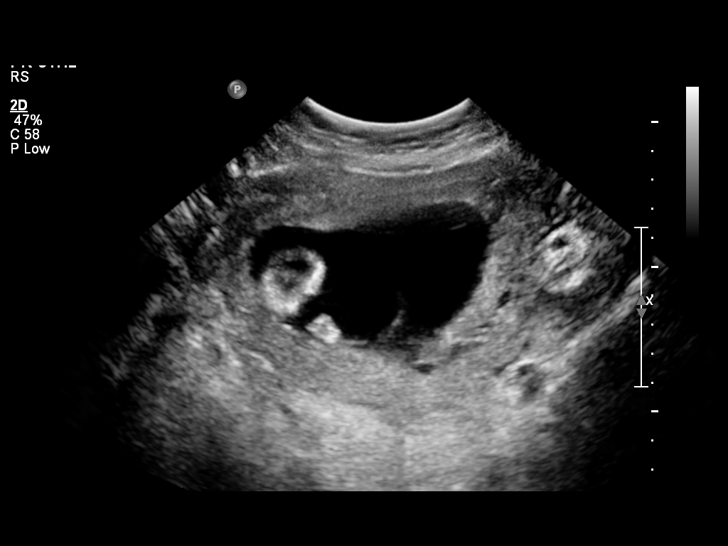
[im 7/17]
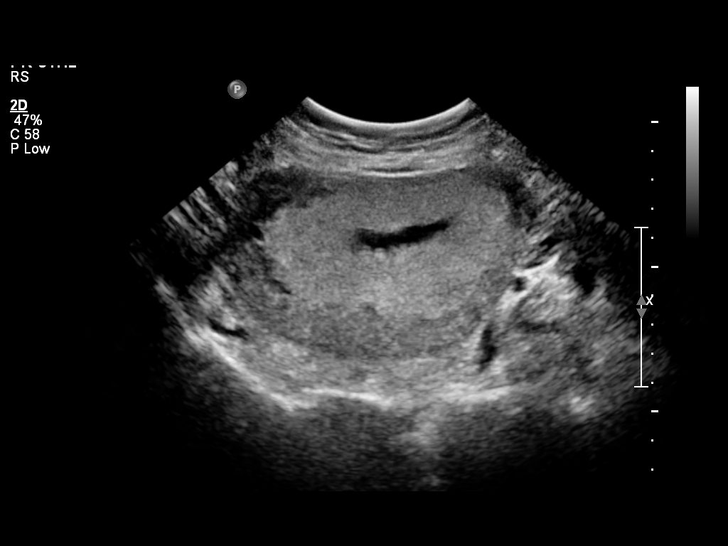
[im 8/17]
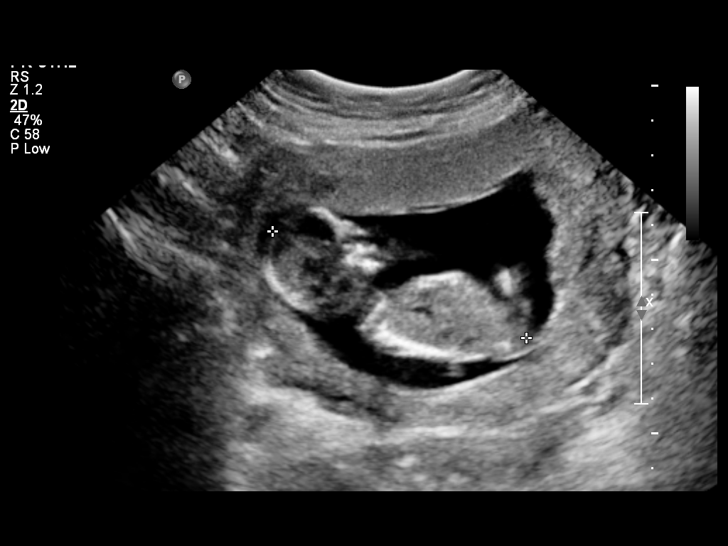
[im 10/17]
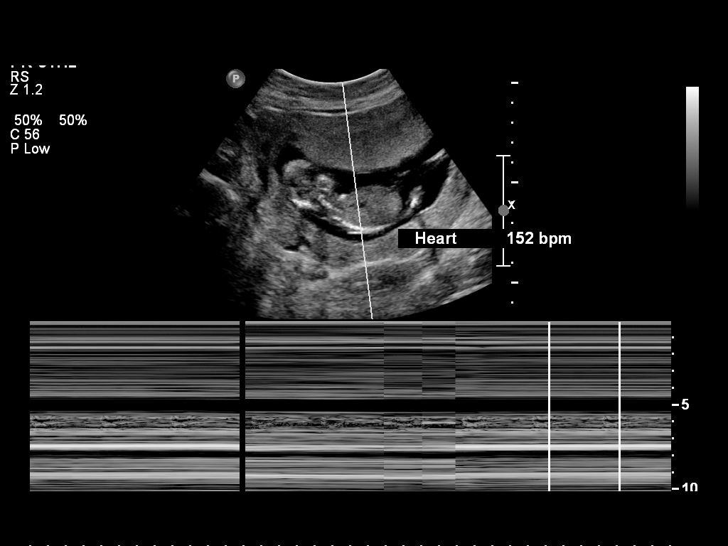
[im 11/17]
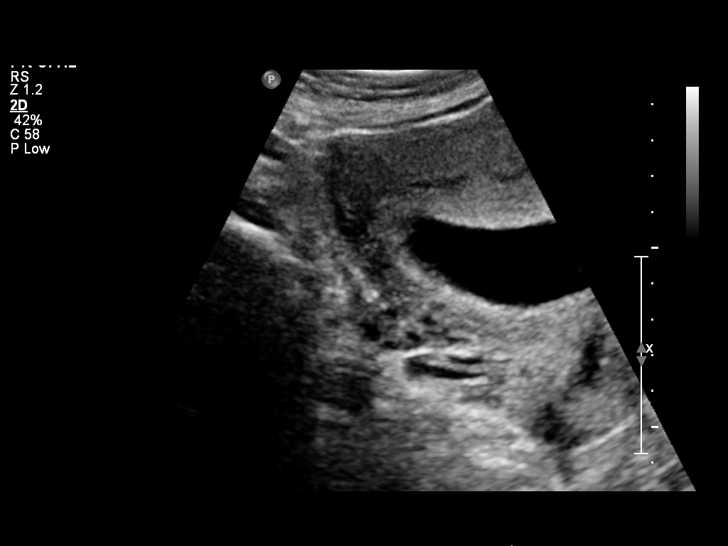
[im 12/17]
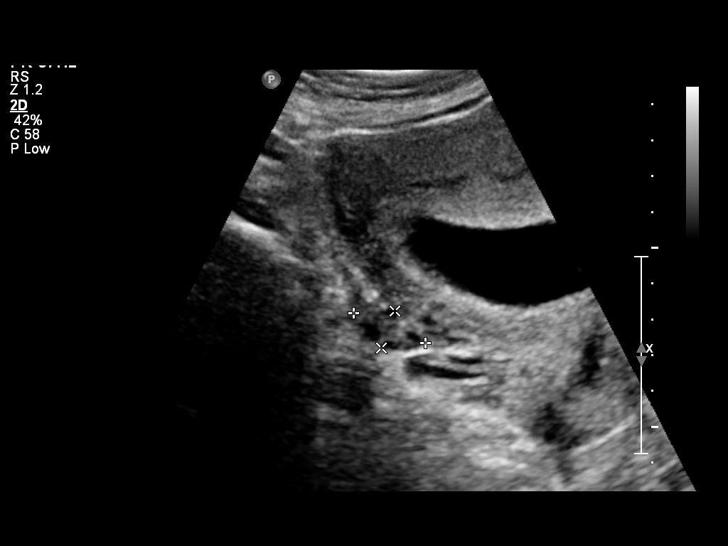
[im 13/17]
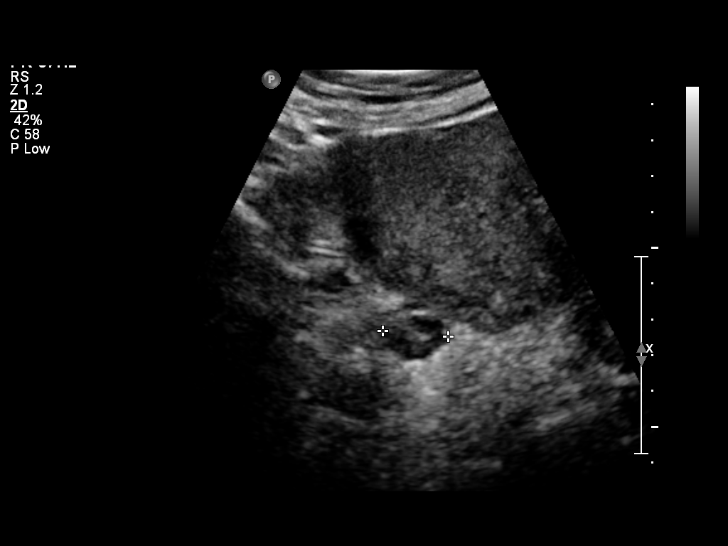
[im 14/17]
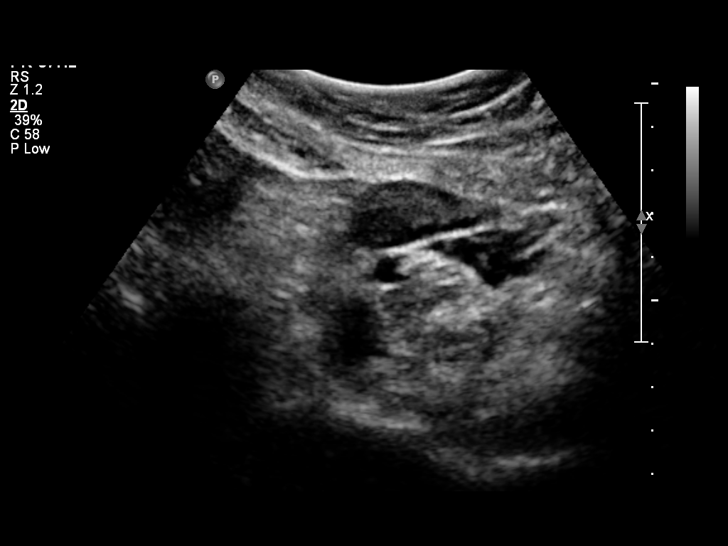
[im 16/17]
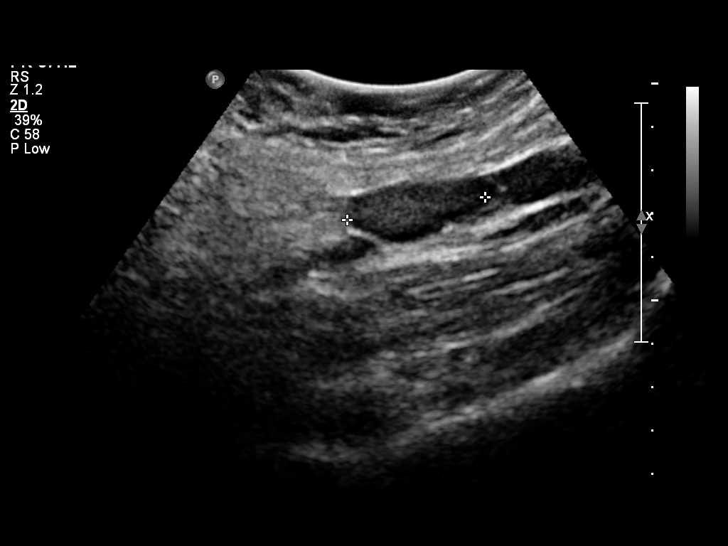
[im 17/17]
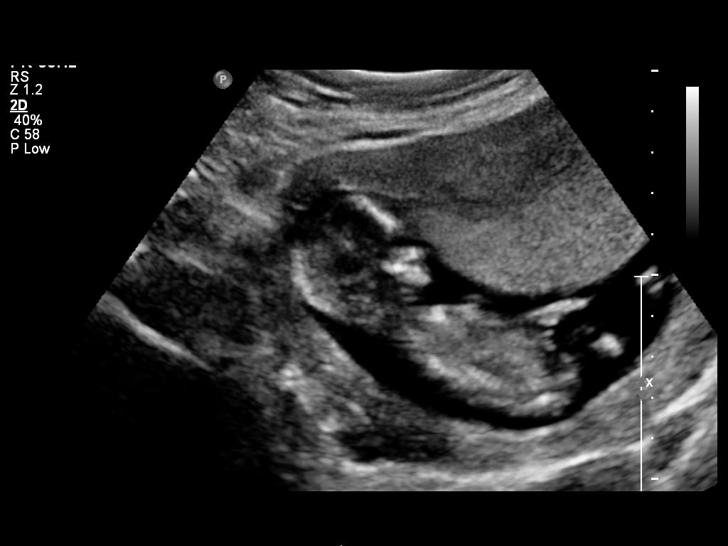

[14 of 17 positions shown; findings below may reference images not displayed]

Intrauterine gestational sac: Single intrauterine gestational sac
containing a fetus.
Yolk sac: Not visualized
Embryo: Yes
Cardiac Activity: Yes
Heart Rate: 152 bpm

CRL:  78.4 mm  14 w  zero d         US EDC: May 08, 2013

Maternal uterus/Adnexae:
No subchorionic hemorrhage identified.  The left ovary is
sonographically unremarkable 3.3 x 1.4 x 8-0.2 cm.  The right ovary
is sonographically unremarkable at 2.1 x 1.1 x 1.8 cm.  No
significant free fluid.
IMPRESSION: Viable single intrauterine gestation as above with an estimated
gestational age of 14 weeks 0 days by crown rump length.
